# Patient Record
Sex: Male | Born: 1955 | Race: Black or African American | Hispanic: No | Marital: Married | Smoking: Former smoker
Health system: Southern US, Community
[De-identification: ages and names within clinical notes are randomized; demographics above are authoritative.]

## PROBLEM LIST (undated history)

## (undated) DIAGNOSIS — I10 Essential (primary) hypertension: Secondary | ICD-10-CM

## (undated) DIAGNOSIS — M1990 Unspecified osteoarthritis, unspecified site: Secondary | ICD-10-CM

## (undated) DIAGNOSIS — T7840XA Allergy, unspecified, initial encounter: Secondary | ICD-10-CM

## (undated) DIAGNOSIS — J45909 Unspecified asthma, uncomplicated: Secondary | ICD-10-CM

## (undated) DIAGNOSIS — E119 Type 2 diabetes mellitus without complications: Secondary | ICD-10-CM

## (undated) DIAGNOSIS — K219 Gastro-esophageal reflux disease without esophagitis: Secondary | ICD-10-CM

## (undated) HISTORY — DX: Unspecified osteoarthritis, unspecified site: M19.90

## (undated) HISTORY — DX: Essential (primary) hypertension: I10

## (undated) HISTORY — PX: WISDOM TOOTH EXTRACTION: SHX21

## (undated) HISTORY — DX: Unspecified asthma, uncomplicated: J45.909

## (undated) HISTORY — DX: Gastro-esophageal reflux disease without esophagitis: K21.9

## (undated) HISTORY — DX: Type 2 diabetes mellitus without complications: E11.9

## (undated) HISTORY — DX: Allergy, unspecified, initial encounter: T78.40XA

---
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
## 1999-09-26 ENCOUNTER — Emergency Department (HOSPITAL_COMMUNITY): Admission: EM | Admit: 1999-09-26 | Discharge: 1999-09-26 | Payer: Self-pay | Admitting: Emergency Medicine

## 1999-09-26 ENCOUNTER — Encounter: Payer: Self-pay | Admitting: Emergency Medicine

## 2003-04-18 ENCOUNTER — Emergency Department (HOSPITAL_COMMUNITY): Admission: EM | Admit: 2003-04-18 | Discharge: 2003-04-18 | Payer: Self-pay | Admitting: Emergency Medicine

## 2003-04-20 ENCOUNTER — Emergency Department (HOSPITAL_COMMUNITY): Admission: EM | Admit: 2003-04-20 | Discharge: 2003-04-20 | Payer: Self-pay | Admitting: Emergency Medicine

## 2003-07-30 ENCOUNTER — Emergency Department (HOSPITAL_COMMUNITY): Admission: EM | Admit: 2003-07-30 | Discharge: 2003-07-30 | Payer: Self-pay | Admitting: Emergency Medicine

## 2007-02-19 ENCOUNTER — Emergency Department (HOSPITAL_COMMUNITY): Admission: EM | Admit: 2007-02-19 | Discharge: 2007-02-19 | Payer: Self-pay | Admitting: Emergency Medicine

## 2007-04-11 ENCOUNTER — Emergency Department (HOSPITAL_COMMUNITY): Admission: EM | Admit: 2007-04-11 | Discharge: 2007-04-11 | Payer: Self-pay | Admitting: Emergency Medicine

## 2012-09-15 ENCOUNTER — Other Ambulatory Visit (INDEPENDENT_AMBULATORY_CARE_PROVIDER_SITE_OTHER): Payer: BC Managed Care – PPO

## 2012-09-15 ENCOUNTER — Ambulatory Visit (INDEPENDENT_AMBULATORY_CARE_PROVIDER_SITE_OTHER): Payer: BC Managed Care – PPO | Admitting: Internal Medicine

## 2012-09-15 ENCOUNTER — Encounter: Payer: Self-pay | Admitting: Internal Medicine

## 2012-09-15 VITALS — BP 128/70 | HR 83 | Temp 97.3°F | Resp 16 | Ht 68.0 in | Wt 171.0 lb

## 2012-09-15 DIAGNOSIS — IMO0001 Reserved for inherently not codable concepts without codable children: Secondary | ICD-10-CM

## 2012-09-15 DIAGNOSIS — E109 Type 1 diabetes mellitus without complications: Secondary | ICD-10-CM | POA: Insufficient documentation

## 2012-09-15 DIAGNOSIS — N529 Male erectile dysfunction, unspecified: Secondary | ICD-10-CM

## 2012-09-15 DIAGNOSIS — Z23 Encounter for immunization: Secondary | ICD-10-CM

## 2012-09-15 LAB — COMPREHENSIVE METABOLIC PANEL
ALT: 20 U/L (ref 0–53)
AST: 15 U/L (ref 0–37)
Albumin: 3.8 g/dL (ref 3.5–5.2)
Alkaline Phosphatase: 81 U/L (ref 39–117)
BUN: 14 mg/dL (ref 6–23)
Chloride: 103 mEq/L (ref 96–112)
Potassium: 4.3 mEq/L (ref 3.5–5.1)
Sodium: 136 mEq/L (ref 135–145)
Total Protein: 7.5 g/dL (ref 6.0–8.3)

## 2012-09-15 LAB — MICROALBUMIN / CREATININE URINE RATIO
Creatinine,U: 66 mg/dL
Microalb, Ur: 0.8 mg/dL (ref 0.0–1.9)

## 2012-09-15 LAB — CBC WITH DIFFERENTIAL/PLATELET
Basophils Absolute: 0 10*3/uL (ref 0.0–0.1)
Eosinophils Absolute: 0.1 10*3/uL (ref 0.0–0.7)
Lymphocytes Relative: 44.8 % (ref 12.0–46.0)
MCHC: 33.3 g/dL (ref 30.0–36.0)
MCV: 86.9 fl (ref 78.0–100.0)
Monocytes Absolute: 0.6 10*3/uL (ref 0.1–1.0)
Neutrophils Relative %: 46.2 % (ref 43.0–77.0)
Platelets: 262 10*3/uL (ref 150.0–400.0)
RDW: 13.3 % (ref 11.5–14.6)

## 2012-09-15 LAB — URINALYSIS, ROUTINE W REFLEX MICROSCOPIC
Bilirubin Urine: NEGATIVE
Hgb urine dipstick: NEGATIVE
Ketones, ur: NEGATIVE
Leukocytes, UA: NEGATIVE
Specific Gravity, Urine: 1.015 (ref 1.000–1.030)
Urine Glucose: >=1000
Urobilinogen, UA: 0.2 (ref 0.0–1.0)

## 2012-09-15 LAB — LIPID PANEL
Total CHOL/HDL Ratio: 7
Triglycerides: 236 mg/dL — ABNORMAL HIGH (ref 0.0–149.0)

## 2012-09-15 LAB — HEMOGLOBIN A1C: Hgb A1c MFr Bld: 13.3 % — ABNORMAL HIGH (ref 4.6–6.5)

## 2012-09-15 MED ORDER — SITAGLIPTIN PHOS-METFORMIN HCL 50-1000 MG PO TABS: 1.0000 | ORAL_TABLET | Freq: Two times a day (BID) | ORAL | Status: DC

## 2012-09-15 MED ORDER — INSULIN GLARGINE 100 UNIT/ML ~~LOC~~ SOLN: 50.0000 [IU] | Freq: Every day | SUBCUTANEOUS | Status: DC

## 2012-09-15 NOTE — Patient Instructions (Signed)

## 2012-09-15 NOTE — Progress Notes (Signed)
Subjective:    Patient ID: Antonio Baldwin, male    DOB: [birth year], 56 y.o.   MRN: [id number]  Diabetes He presents for his follow-up diabetic visit. He has type 1 diabetes mellitus. The initial diagnosis of diabetes was made 6 years ago. His disease course has been worsening. There are no hypoglycemic associated symptoms. Pertinent negatives for hypoglycemia include no dizziness. Associated symptoms include polydipsia, polyphagia, polyuria and weight loss. Pertinent negatives for diabetes include no blurred vision, no chest pain, no fatigue, no foot paresthesias, no foot ulcerations, no visual change and no weakness. There are no hypoglycemic complications. Diabetic complications include impotence. When asked about current treatments, none were reported. He is compliant with treatment none of the time. His weight is stable. He is following a generally unhealthy diet. When asked about meal planning, he reported none. He has not had a previous visit with a dietician. He participates in exercise intermittently. His home blood glucose trend is increasing steadily. His breakfast blood glucose range is generally >200 mg/dl. His lunch blood glucose range is generally >200 mg/dl. His dinner blood glucose range is generally >200 mg/dl. His highest blood glucose is >200 mg/dl. His overall blood glucose range is >200 mg/dl. An ACE inhibitor/angiotensin II receptor blocker is not being taken. He does not see a podiatrist.Eye exam is not current.      Review of Systems  Constitutional: Positive for weight loss and unexpected weight change. Negative for fever, chills, diaphoresis, activity change, appetite change and fatigue.  HENT: Negative.   Eyes: Negative.  Negative for blurred vision.  Respiratory: Negative for cough, chest tightness, shortness of breath, wheezing and stridor.   Cardiovascular: Negative for chest pain, palpitations and leg swelling.  Gastrointestinal: Negative for nausea, vomiting,  abdominal pain, diarrhea, constipation and abdominal distention.  Genitourinary: Positive for polyuria and impotence.  Musculoskeletal: Negative.   Skin: Negative.   Neurological: Negative.  Negative for dizziness and weakness.  Hematological: Positive for polydipsia and polyphagia. Negative for adenopathy. Does not bruise/bleed easily.  Psychiatric/Behavioral: Negative.        Objective:   Physical Exam  Vitals reviewed. Constitutional: He is oriented to person, place, and time. He appears well-developed and well-nourished. No distress.  HENT:  Head: Normocephalic and atraumatic.  Mouth/Throat: Oropharynx is clear and moist. No oropharyngeal exudate.  Eyes: Conjunctivae normal are normal. Right eye exhibits no discharge. Left eye exhibits no discharge. No scleral icterus.  Neck: Normal range of motion. Neck supple. No JVD present. No tracheal deviation present. No thyromegaly present.  Cardiovascular: Normal rate, regular rhythm, normal heart sounds and intact distal pulses.  Exam reveals no gallop and no friction rub.   No murmur heard. Pulmonary/Chest: Effort normal and breath sounds normal. No stridor. No respiratory distress. He has no wheezes. He has no rales. He exhibits no tenderness.  Abdominal: Soft. Bowel sounds are normal. He exhibits no distension and no mass. There is no tenderness. There is no rebound and no guarding.  Musculoskeletal: Normal range of motion. He exhibits no edema and no tenderness.  Lymphadenopathy:    He has no cervical adenopathy.  Neurological: He is oriented to person, place, and time.  Skin: Skin is warm and dry. No rash noted. He is not diaphoretic. No erythema. No pallor.  Psychiatric: He has a normal mood and affect. His behavior is normal. Judgment and thought content normal.      No results found for this basename: WBC, HGB, HCT, PLT, GLUCOSE, CHOL, TRIG, HDL, LDLDIRECT, LDLCALC,  ALT, AST, NA, K, CL, CREATININE, BUN, CO2, TSH, PSA, INR, GLUF,  HGBA1C, MICROALBUR      Assessment & Plan:

## 2012-09-16 ENCOUNTER — Encounter: Payer: Self-pay | Admitting: Internal Medicine

## 2012-09-16 LAB — TSH: TSH: 0.58 u[IU]/mL (ref 0.35–5.50)

## 2012-09-16 LAB — TESTOSTERONE, FREE, TOTAL, SHBG
Sex Hormone Binding: 34 nmol/L (ref 13–71)
Testosterone, Free: 62.4 pg/mL (ref 47.0–244.0)

## 2012-09-16 LAB — PSA: PSA: 0.37 ng/mL (ref 0.10–4.00)

## 2012-09-16 LAB — C-PEPTIDE: C-Peptide: 1.01 ng/mL (ref 0.80–3.90)

## 2012-09-16 NOTE — Assessment & Plan Note (Signed)
His blood sugars are poorly controlled, I have asked him to restart insulin and oral meds, I will check his a1c and renal function today

## 2012-09-16 NOTE — Assessment & Plan Note (Signed)
This is most likely caused by years of poorly controlled DM, I will check his labs today to look for secondary causes (thyroid dz., low T, anemia, prostate cancer,etc.)

## 2012-11-16 ENCOUNTER — Emergency Department (HOSPITAL_COMMUNITY)
Admission: EM | Admit: 2012-11-16 | Discharge: 2012-11-16 | Disposition: A | Discharge status: Home / Self Care | Payer: BC Managed Care – PPO | Attending: Emergency Medicine | Admitting: Emergency Medicine

## 2012-11-16 ENCOUNTER — Encounter (HOSPITAL_COMMUNITY): Payer: Self-pay | Admitting: *Deleted

## 2012-11-16 DIAGNOSIS — K089 Disorder of teeth and supporting structures, unspecified: Secondary | ICD-10-CM | POA: Insufficient documentation

## 2012-11-16 DIAGNOSIS — Z794 Long term (current) use of insulin: Secondary | ICD-10-CM | POA: Insufficient documentation

## 2012-11-16 DIAGNOSIS — E119 Type 2 diabetes mellitus without complications: Secondary | ICD-10-CM | POA: Insufficient documentation

## 2012-11-16 DIAGNOSIS — Z87891 Personal history of nicotine dependence: Secondary | ICD-10-CM | POA: Insufficient documentation

## 2012-11-16 DIAGNOSIS — K0889 Other specified disorders of teeth and supporting structures: Secondary | ICD-10-CM

## 2012-11-16 DIAGNOSIS — Z8739 Personal history of other diseases of the musculoskeletal system and connective tissue: Secondary | ICD-10-CM | POA: Insufficient documentation

## 2012-11-16 DIAGNOSIS — Z79899 Other long term (current) drug therapy: Secondary | ICD-10-CM | POA: Insufficient documentation

## 2012-11-16 DIAGNOSIS — J45909 Unspecified asthma, uncomplicated: Secondary | ICD-10-CM | POA: Insufficient documentation

## 2012-11-16 MED ORDER — PENICILLIN V POTASSIUM 500 MG PO TABS: 500.0000 mg | ORAL_TABLET | Freq: Three times a day (TID) | ORAL | Status: DC

## 2012-11-16 MED ORDER — HYDROCODONE-ACETAMINOPHEN 5-325 MG PO TABS: 2.0000 | ORAL_TABLET | ORAL | Status: DC | PRN

## 2012-11-16 MED ORDER — IBUPROFEN 800 MG PO TABS
800.0000 mg | ORAL_TABLET | Freq: Once | ORAL | Status: AC
  Administered 2012-11-16: 800 mg via ORAL
  Filled 2012-11-16: qty 1

## 2012-11-16 NOTE — ED Notes (Signed)
Pt states injured upper left tooth and gum while eating x 3 days ago.

## 2012-11-16 NOTE — ED Provider Notes (Signed)
History     CSN: [id number]  Arrival date & time 11/16/12  0818   First MD Initiated Contact with Patient 11/16/12 0830      Chief Complaint  Patient presents with  . Dental Injury    upper left tooth    (Consider location/radiation/quality/duration/timing/severity/associated sxs/prior treatment) HPI Comments: The patient is a 56 year old otherwise healthy male who presents with dental pain that started gradually 5 days. The dental pain is severe, constant and progressively worsening. The pain is aching and located in left upper jaw. The pain does not radiate. Eating makes the pain worse. Nothing makes the pain better. The patient has not tried anything for pain. No associated symptoms. Patient denies headache, neck pain/stiffness, fever, NVD, edema, sore throat, throat swelling, wheezing, SOB, chest pain, abdominal pain.     Patient is a 56 y.o. male presenting with dental injury.  Dental Injury    Past Medical History  Diagnosis Date  . Asthma   . Arthritis   . Diabetes mellitus without complication   . Allergy     Past Surgical History  Procedure Date  . Tonsillectomy and adenoidectomy 1960    Family History  Problem Relation Age of Onset  . Arthritis Other   . Hypertension Other   . Diabetes Other   . Arthritis Mother   . Hypertension Mother   . Diabetes Mother   . Heart disease Neg Hx   . Early death Neg Hx   . Stroke Neg Hx     History  Substance Use Topics  . Smoking status: Former Smoker    Quit date: 09/15/2002  . Smokeless tobacco: Never Used  . Alcohol Use: No      Review of Systems  HENT: Positive for dental problem.   All other systems reviewed and are negative.    Allergies  Review of patient's allergies indicates no known allergies.  Home Medications   Current Outpatient Rx  Name  Route  Sig  Dispense  Refill  . INSULIN GLARGINE 100 UNIT/ML [place] SOLN   Subcutaneous   Inject 50 Units into the skin daily.   3 mL   11   .  SITAGLIPTIN-METFORMIN HCL 50-1000 MG PO TABS   Oral   Take 1 tablet by mouth 2 (two) times daily with a meal.   112 tablet   0     BP 162/82  Pulse 96  Temp 98.4 F (36.9 C) (Oral)  Resp 16  Ht 5\' 8"  (1.727 m)  Wt 164 lb (74.39 kg)  BMI 24.94 kg/m2  SpO2 96%  Physical Exam  Nursing note and vitals reviewed. Constitutional: He is oriented to person, place, and time. He appears well-developed and well-nourished. No distress.  HENT:  Head: Normocephalic and atraumatic.  Mouth/Throat: Oropharynx is clear and moist. No oropharyngeal exudate.       Poor dentition. Edematous and erythematous gums. Teeth non tender to percussion.   Eyes: Conjunctivae normal and EOM are normal. Pupils are equal, round, and reactive to light.  Neck: Normal range of motion. Neck supple.  Cardiovascular: Normal rate and regular rhythm.  Exam reveals no gallop and no friction rub.   No murmur heard. Pulmonary/Chest: Effort normal and breath sounds normal. He has no wheezes. He has no rales. He exhibits no tenderness.  Abdominal: Soft. There is no tenderness.  Musculoskeletal: Normal range of motion.  Neurological: He is alert and oriented to person, place, and time.       Speech is  goal-oriented. Moves limbs without ataxia.   Skin: Skin is warm and dry.  Psychiatric: He has a normal mood and affect. His behavior is normal.    ED Course  Procedures (including critical care time)  Labs Reviewed - No data to display No results found.   1. Pain, dental       MDM  8:40 AM Patient has a dentist that he will follow up with after Christmas. Vitals stable. Patient will have pain medicine and antibiotic prescription. No further evaluation needed at this time.         Emilia Beck, [place] 11/16/12 [phone]

## 2012-11-17 NOTE — ED Provider Notes (Signed)
Medical screening examination/treatment/procedure(s) were performed by non-physician practitioner and as supervising physician I was immediately available for consultation/collaboration.   Shelda Jakes, MD 11/17/12 [phone]

## 2013-01-17 ENCOUNTER — Encounter (HOSPITAL_COMMUNITY): Payer: Self-pay | Admitting: Emergency Medicine

## 2013-01-17 ENCOUNTER — Emergency Department (HOSPITAL_COMMUNITY)
Admission: EM | Admit: 2013-01-17 | Discharge: 2013-01-17 | Disposition: A | Discharge status: Home / Self Care | Payer: BC Managed Care – PPO | Attending: Emergency Medicine | Admitting: Emergency Medicine

## 2013-01-17 DIAGNOSIS — Z87891 Personal history of nicotine dependence: Secondary | ICD-10-CM | POA: Insufficient documentation

## 2013-01-17 DIAGNOSIS — E119 Type 2 diabetes mellitus without complications: Secondary | ICD-10-CM | POA: Insufficient documentation

## 2013-01-17 DIAGNOSIS — Z8739 Personal history of other diseases of the musculoskeletal system and connective tissue: Secondary | ICD-10-CM | POA: Insufficient documentation

## 2013-01-17 DIAGNOSIS — M5416 Radiculopathy, lumbar region: Secondary | ICD-10-CM

## 2013-01-17 DIAGNOSIS — Z792 Long term (current) use of antibiotics: Secondary | ICD-10-CM | POA: Insufficient documentation

## 2013-01-17 DIAGNOSIS — Z794 Long term (current) use of insulin: Secondary | ICD-10-CM | POA: Insufficient documentation

## 2013-01-17 DIAGNOSIS — Z79899 Other long term (current) drug therapy: Secondary | ICD-10-CM | POA: Insufficient documentation

## 2013-01-17 DIAGNOSIS — IMO0002 Reserved for concepts with insufficient information to code with codable children: Secondary | ICD-10-CM | POA: Insufficient documentation

## 2013-01-17 DIAGNOSIS — J45909 Unspecified asthma, uncomplicated: Secondary | ICD-10-CM | POA: Insufficient documentation

## 2013-01-17 MED ORDER — TRAMADOL HCL 50 MG PO TABS: 50.0000 mg | ORAL_TABLET | Freq: Four times a day (QID) | ORAL | Status: DC | PRN

## 2013-01-17 NOTE — ED Notes (Signed)
Woke this am with right thigh pain when he went to get out of bed. Now pain with movement.

## 2013-01-17 NOTE — ED Provider Notes (Signed)
Medical screening examination/treatment/procedure(s) were performed by non-physician practitioner and as supervising physician I was immediately available for consultation/collaboration. Devoria Albe, MD, [facility]   Ward Givens, MD 01/17/13 [phone]

## 2013-01-17 NOTE — ED Provider Notes (Signed)
History     CSN: [id number]  Arrival date & time 01/17/13  4098   First MD Initiated Contact with Patient 01/17/13 [phone]      No chief complaint on file.   (Consider location/radiation/quality/duration/timing/severity/associated sxs/prior treatment) HPI  Antonio Baldwin is a 57 y.o. male complaining of right hip pain that radiates to the knee onset yesterday a.m. Pain is described as aching, 8/10 right now, was 10 out of 10 yesterday. Patient has not taken any pain medication. Pain is exacerbated by standing. Is relieved by laying flat. Patient denies any trauma, prior injury to the affected area, similar prior episodes, numbness or weakness.  Past Medical History  Diagnosis Date  . Asthma   . Arthritis   . Diabetes mellitus without complication   . Allergy     Past Surgical History  Procedure Laterality Date  . Tonsillectomy and adenoidectomy  1960    Family History  Problem Relation Age of Onset  . Arthritis Other   . Hypertension Other   . Diabetes Other   . Arthritis Mother   . Hypertension Mother   . Diabetes Mother   . Heart disease Neg Hx   . Early death Neg Hx   . Stroke Neg Hx     History  Substance Use Topics  . Smoking status: Former Smoker    Quit date: 09/15/2002  . Smokeless tobacco: Never Used  . Alcohol Use: No      Review of Systems  Constitutional: Negative for fever.  Respiratory: Negative for shortness of breath.   Cardiovascular: Negative for chest pain.  Gastrointestinal: Negative for nausea, vomiting, abdominal pain and diarrhea.  Musculoskeletal: Positive for arthralgias.  All other systems reviewed and are negative.    Allergies  Review of patient's allergies indicates no known allergies.  Home Medications   Current Outpatient Rx  Name  Route  Sig  Dispense  Refill  . insulin glargine (LANTUS) 100 UNIT/ML injection   Subcutaneous   Inject 50 Units into the skin daily.         . penicillin v potassium (VEETID) 500 MG  tablet   Oral   Take 500 mg by mouth 3 (three) times daily.         . sitaGLIPtan-metformin (JANUMET) 50-1000 MG per tablet   Oral   Take 1 tablet by mouth 2 (two) times daily with a meal.           BP 139/76  Pulse 89  Temp(Src) 97.1 F (36.2 C) (Oral)  SpO2 100%  Physical Exam  Nursing note and vitals reviewed. Constitutional: He is oriented to person, place, and time. He appears well-developed and well-nourished. No distress.  HENT:  Head: Normocephalic.  Mouth/Throat: Oropharynx is clear and moist.  Eyes: Conjunctivae and EOM are normal. Pupils are equal, round, and reactive to light.  Neck: Normal range of motion. Neck supple.  Cardiovascular: Normal rate, regular rhythm and normal heart sounds.   Pulmonary/Chest: Effort normal and breath sounds normal. No stridor.  Abdominal: Soft. Bowel sounds are normal.  Musculoskeletal: Normal range of motion.  Straight leg raise positive on the ipsilateral (right) side at 10, straight leg raise is positive on the contralateral side at approximately 50 degrees, dorsalis pedis is 2+ bilaterally, strength is 5 out of 5x4 extremities, distal sensation is grossly intact.   Neurological: He is alert and oriented to person, place, and time.  Psychiatric: He has a normal mood and affect.    ED Course  Procedures (  including critical care time)  Labs Reviewed - No data to display No results found.   1. Lumbar radiculopathy       MDM  Radicular pain, discussed with patient, will recommend NSAIDs for anti-inflammatory properties and tramadol for pain control.   Pt verbalized understanding and agrees with care plan. Outpatient follow-up and return precautions given.     New Prescriptions   TRAMADOL (ULTRAM) 50 MG TABLET    Take 1 tablet (50 mg total) by mouth every 6 (six) hours as needed for pain.          Wynetta Emery, PA-C 01/17/13 [phone]

## 2013-04-11 ENCOUNTER — Telehealth: Payer: Self-pay | Admitting: Internal Medicine

## 2013-04-11 NOTE — Telephone Encounter (Signed)
Patient is scheduled to see Dr. Yetta Barre Friday 04/15/13, he has been out of his insulin and his blood sugar is high, pt is requesting sample of insulin to get him through to his appt

## 2013-04-11 NOTE — Telephone Encounter (Signed)
Pt made aware sample is ready to be picked up.

## 2013-04-15 ENCOUNTER — Telehealth: Payer: Self-pay

## 2013-04-15 ENCOUNTER — Other Ambulatory Visit (INDEPENDENT_AMBULATORY_CARE_PROVIDER_SITE_OTHER): Payer: BC Managed Care – PPO

## 2013-04-15 ENCOUNTER — Encounter: Payer: Self-pay | Admitting: Internal Medicine

## 2013-04-15 ENCOUNTER — Ambulatory Visit (INDEPENDENT_AMBULATORY_CARE_PROVIDER_SITE_OTHER): Payer: BC Managed Care – PPO | Admitting: Internal Medicine

## 2013-04-15 VITALS — BP 138/86 | HR 80 | Temp 98.1°F | Resp 16 | Wt 177.0 lb

## 2013-04-15 DIAGNOSIS — E785 Hyperlipidemia, unspecified: Secondary | ICD-10-CM

## 2013-04-15 DIAGNOSIS — E109 Type 1 diabetes mellitus without complications: Secondary | ICD-10-CM

## 2013-04-15 DIAGNOSIS — I10 Essential (primary) hypertension: Secondary | ICD-10-CM | POA: Insufficient documentation

## 2013-04-15 LAB — COMPREHENSIVE METABOLIC PANEL
AST: 17 U/L (ref 0–37)
Alkaline Phosphatase: 71 U/L (ref 39–117)
BUN: 11 mg/dL (ref 6–23)
Creatinine, Ser: 0.6 mg/dL (ref 0.4–1.5)
Potassium: 3.7 mEq/L (ref 3.5–5.1)

## 2013-04-15 LAB — HM DIABETES FOOT EXAM

## 2013-04-15 MED ORDER — ROSUVASTATIN CALCIUM 10 MG PO TABS: 10.0000 mg | ORAL_TABLET | Freq: Every day | ORAL | Status: DC

## 2013-04-15 MED ORDER — INSULIN GLARGINE 100 UNIT/ML ~~LOC~~ SOLN: 50.0000 [IU] | Freq: Every day | SUBCUTANEOUS | Status: DC

## 2013-04-15 MED ORDER — AZILSARTAN MEDOXOMIL 40 MG PO TABS: 1.0000 | ORAL_TABLET | Freq: Every day | ORAL | Status: DC

## 2013-04-15 MED ORDER — INSULIN ASPART 100 UNIT/ML FLEXPEN: 5.0000 [IU] | PEN_INJECTOR | Freq: Three times a day (TID) | SUBCUTANEOUS | Status: DC

## 2013-04-15 NOTE — Assessment & Plan Note (Signed)
His Blood sugars are too high I have asked him to start a mealtime insulin This is complicated so I have also asked him to see Endo and diabetic education

## 2013-04-15 NOTE — Assessment & Plan Note (Signed)
He will start an ACEI Today I will check his lytes and renal function

## 2013-04-15 NOTE — Progress Notes (Signed)
Subjective:    Patient ID: Antonio Baldwin, male    DOB: [birth year], 57 y.o.   MRN: [id number]  Diabetes He presents for his follow-up diabetic visit. He has type 1 diabetes mellitus. Pertinent negatives for hypoglycemia include no dizziness. Associated symptoms include polydipsia, polyphagia and polyuria. Pertinent negatives for diabetes include no chest pain, no fatigue, no foot paresthesias, no foot ulcerations, no visual change, no weakness and no weight loss. There are no hypoglycemic complications. Diabetic complications include impotence. There are no known risk factors for coronary artery disease. Current diabetic treatment includes oral agent (monotherapy) and insulin injections. He is compliant with treatment some of the time (he is unable to pay for most of his meds). His weight is stable. He is following a generally healthy diet. Meal planning includes avoidance of concentrated sweets. He has not had a previous visit with a dietician. He participates in exercise three times a week. There is no change in his home blood glucose trend. His breakfast blood glucose range is generally 140-180 mg/dl. His lunch blood glucose range is generally 180-200 mg/dl. His dinner blood glucose range is generally 180-200 mg/dl. His highest blood glucose is >200 mg/dl. His overall blood glucose range is 180-200 mg/dl. An ACE inhibitor/angiotensin II receptor blocker is not being taken. He does not see a podiatrist.Eye exam is not current.      Review of Systems  Constitutional: Negative.  Negative for weight loss and fatigue.  HENT: Negative.   Eyes: Negative.   Respiratory: Negative.  Negative for cough, chest tightness, shortness of breath, wheezing and stridor.   Cardiovascular: Negative for chest pain, palpitations and leg swelling.  Gastrointestinal: Negative.  Negative for nausea, abdominal pain, diarrhea, constipation and blood in stool.  Endocrine: Positive for polydipsia, polyphagia and polyuria.   Genitourinary: Positive for impotence.  Musculoskeletal: Negative.   Skin: Negative.   Allergic/Immunologic: Negative.   Neurological: Negative.  Negative for dizziness, weakness and light-headedness.  Hematological: Negative.  Negative for adenopathy. Does not bruise/bleed easily.  Psychiatric/Behavioral: Negative.        Objective:   Physical Exam  Vitals reviewed. Constitutional: He is oriented to person, place, and time. He appears well-developed and well-nourished. No distress.  HENT:  Head: Normocephalic and atraumatic.  Mouth/Throat: Oropharynx is clear and moist.  Eyes: Conjunctivae are normal. Right eye exhibits no discharge. Left eye exhibits no discharge. No scleral icterus.  Neck: Normal range of motion. Neck supple. No JVD present. No tracheal deviation present. No thyromegaly present.  Cardiovascular: Normal rate, regular rhythm, normal heart sounds and intact distal pulses.  Exam reveals no gallop and no friction rub.   No murmur heard. Pulmonary/Chest: Effort normal and breath sounds normal. No stridor. No respiratory distress. He has no rales. He exhibits no tenderness.  Abdominal: Soft. Bowel sounds are normal. He exhibits no distension and no mass. There is no tenderness. There is no rebound and no guarding.  Musculoskeletal: Normal range of motion. He exhibits no edema and no tenderness.  Lymphadenopathy:    He has no cervical adenopathy.  Neurological: He is oriented to person, place, and time.  Skin: Skin is warm and dry. No rash noted. He is not diaphoretic. No erythema. No pallor.  Psychiatric: He has a normal mood and affect. His behavior is normal. Judgment and thought content normal.     Lab Results  Component Value Date   WBC 7.3 09/15/2012   HGB 13.9 09/15/2012   HCT 41.8 09/15/2012   PLT 262.0 09/15/2012  GLUCOSE 268* 04/15/2013   CHOL 229* 09/15/2012   TRIG 236.0* 09/15/2012   HDL 32.60* 09/15/2012   LDLDIRECT 146.0 09/15/2012   ALT 20  04/15/2013   AST 17 04/15/2013   NA 135 04/15/2013   K 3.7 04/15/2013   CL 101 04/15/2013   CREATININE 0.6 04/15/2013   BUN 11 04/15/2013   CO2 26 04/15/2013   TSH 0.58 09/15/2012   PSA 0.37 09/15/2012   HGBA1C 15.4* 04/15/2013   MICROALBUR 0.8 09/15/2012       Assessment & Plan:

## 2013-04-15 NOTE — Telephone Encounter (Signed)
Received PA request for insurance company requesting completion to determine if Novolog will be covered.

## 2013-04-15 NOTE — Assessment & Plan Note (Signed)
He will start crestor for risk reduction 

## 2013-04-15 NOTE — Patient Instructions (Signed)
Type 1 Diabetes Mellitus, Adult Type 1 diabetes mellitus, often simply referred to as diabetes, is a long-term (chronic) disease. It occurs when the islet cells in the pancreas that make insulin (a hormone) are destroyed and can no longer make insulin. Insulin is needed to move sugars from food into the tissue cells. The tissue cells use the sugars for energy. In people with type 1 diabetes, the sugars build up in the blood instead of going into the tissue cells. As a result, high blood sugar (hyperglycemia) develops. Without insulin, the body breaks down fat cells for the needed energy. This breakdown of fat cells produces acid chemicals (ketones), which increases the acid levels in the body. The effect of either high ketone or sugar (glucose) levels can be life-threatening.  Type 1 diabetes was also previously called juvenile diabetes. It most often occurs before the age of 30, but it can occur at any age. RISK FACTORS A person is predisposed to developing type 1 diabetes if someone in his or her family has the disease and is exposed to certain additional environmental triggers.  SYMPTOMS  Symptoms of type 1 diabetes may develop gradually over days to weeks or suddenly. The symptoms occur due to hyperglycemia. The symptoms can include:   Increased thirst (polydipsia).  Increased urination (polyuria).  Increased urination during the night (nocturia).  Weight loss. This weight loss may be rapid.  Frequent, recurring infections.  Tiredness (fatigue).  Weakness.  Vision changes, such as blurred vision.  Fruity smell to your breath.  Abdominal pain.  Nausea or vomiting. DIAGNOSIS  Type 1 diabetes is diagnosed when symptoms of diabetes are present and when blood glucose levels are increased. Your blood glucose level may be checked by one or more of the following blood tests:  A fasting blood glucose test. You will not be allowed to eat for at least 8 hours before a blood sample is  taken.  A random blood glucose test. Your blood glucose is checked at any time of the day regardless of when you ate.  A hemoglobin A1c blood glucose test. A hemoglobin A1c test provides information about blood glucose control over the previous 3 months. TREATMENT  Although type 1 diabetes cannot be prevented, it can be managed with insulin, diet, and exercise.  You will need to take insulin daily to keep blood glucose in the desired range.  You will need to match insulin dosing with exercise and healthy food choices. The treatment goal is to maintain the before-meal blood sugar (preprandial glucose) level at 70 130 mg/dL.  HOME CARE INSTRUCTIONS   Have your hemoglobin A1c level checked twice a year.  Perform daily blood glucose monitoring as directed by your caregiver.  Monitor urine ketones when you are ill and as directed by your caregiver.  Take your insulin as directed by your caregiver to maintain your blood glucose level in the desired range.  Never run out of insulin. It is needed every day.  Adjust insulin based on your intake of carbohydrates. Carbohydrates can raise blood glucose levels but need to be included in your diet. Carbohydrates provide vitamins, minerals, and fiber, which are an essential part of a healthy diet. Carbohydrates are found in fruits, vegetables, whole grains, dairy products, legumes, and foods containing added sugars.    Eat healthy foods. Alternate 3 meals with 3 snacks.  Maintain a healthy weight.  Carry a medical alert card or wear your medical alert jewelry.  Carry a 15 gram carbohydrate snack with you   at all times to treat low blood glucose (hypoglycemia). Some examples of 15 gram carbohydrate snacks include:  Glucose tablets, 3 or 4.   Glucose gel, 15 gram tube.  Raisins, 2 tablespoons (24 grams).  Jelly beans, 6.  Animal crackers, 8.  Fruit juice, regular soda, or low-fat milk, 4 ounces (120 mL).  Gummy treats,  9.    Recognize hypoglycemia. Hypoglycemia occurs with blood glucose levels of 70 mg/dL and below. The risk for hypoglycemia increases when fasting or skipping meals, during or after intense exercise, and during sleep. Hypoglycemia symptoms can include:  Tremors or shakes.  Decreased ability to concentrate.  Sweating.  Increased heart rate.  Headache.  Dry mouth.  Hunger.  Irritability.  Anxiety.  Restless sleep.  Altered speech or coordination.  Confusion.  Treat hypoglycemia promptly. If you are alert and able to safely swallow, follow the 15:15 rule:  Take 15 20 grams of rapid-acting glucose or carbohydrate. Rapid-acting options include glucose gel, glucose tablets, or 4 ounces (120 mL) of fruit juice, regular soda, or low-fat milk.  Check your blood glucose level 15 minutes after taking the glucose.   Take 15 20 grams more of glucose if the repeat blood glucose level is still 70 mg/dL or below.  Eat a meal or snack within 1 hour once blood glucose levels return to normal.  Be alert to polyuria and polydipsia, which are early signs of hyperglycemia. An early awareness of hyperglycemia allows for prompt treatment. Treat hyperglycemia as directed by your caregiver.  Engage in at least 150 minutes of moderate-intensity physical activity a week, spread over at least 3 days of the week or as directed by your caregiver.  Adjust your insulin dosing and food intake as needed if you start a new exercise or sport.  Follow your sick day plan at any time you are unable to eat or drink as usual.   Avoid tobacco use.  Limit alcohol intake to no more than 1 drink per day for nonpregnant women and 2 drinks per day for men. You should drink alcohol only when you are also eating food. Talk with your caregiver about whether alcohol is safe for you. Tell your caregiver if you drink alcohol several times a week.  Follow up with your caregiver regularly.  Schedule an eye exam  within 5 years of diagnosis and then annually.  Perform daily skin and foot care. Examine your skin and feet daily for cuts, bruises, redness, nail problems, bleeding, blisters, or sores. A foot exam by a caregiver should be done annually.  Brush your teeth and gums at least twice a day and floss at least once a day. Follow up with your dentist regularly.  Share your diabetes management plan with your workplace or school.  Stay up-to-date with immunizations.  Learn to manage stress.  Obtain ongoing diabetes education and support as needed.  Participate or seek rehabilitation as needed to maintain or improve independence and quality of life. Request a physical or occupational therapy referral if you are having foot or hand numbness or difficulties with grooming, dressing, eating, or physical activity. SEEK MEDICAL CARE IF:   You are unable to eat food or drink fluids for more than 6 hours.  You have nausea and vomiting for more than 6 hours.  Your blood glucose level is over 240 mg/dL.  There is a change in mental status.  You develop an additional serious illness.  You have diarrhea for more than 6 hours.  You have been   sick or have had a fever for a couple of days and are not getting better.  You have pain during any physical activity. SEEK IMMEDIATE MEDICAL CARE IF:  You have difficulty breathing.  You have moderate to large ketone levels. MAKE SURE YOU:  Understand these instructions.  Will watch your condition.  Will get help right away if you are not doing well or get worse. Document Released: 11/07/2000 Document Revised: 08/04/2012 Document Reviewed: 06/08/2012 [employer] Patient Information 2014 [employer].  

## 2013-04-19 NOTE — Telephone Encounter (Signed)
Received fax also requesting PA on [employer]

## 2013-04-20 NOTE — Telephone Encounter (Signed)
Insurance approved Edarbi  04/19/13 to 11/23/38, pharmacy notified

## 2013-04-25 ENCOUNTER — Ambulatory Visit (INDEPENDENT_AMBULATORY_CARE_PROVIDER_SITE_OTHER): Payer: BC Managed Care – PPO | Admitting: Endocrinology

## 2013-04-25 ENCOUNTER — Encounter: Payer: Self-pay | Admitting: Endocrinology

## 2013-04-25 VITALS — BP 126/80 | HR 80 | Ht 68.0 in | Wt 182.0 lb

## 2013-04-25 DIAGNOSIS — E109 Type 1 diabetes mellitus without complications: Secondary | ICD-10-CM

## 2013-04-25 NOTE — Patient Instructions (Addendum)
good diet and exercise habits significanly improve the control of your diabetes.  please let me know if you wish to be referred to a dietician.  high blood sugar is very risky to your health.  you should see an eye doctor every year.  You are at higher than average risk for pneumonia and hepatitis-B.  You should be vaccinated against both.   controlling your blood pressure and cholesterol drastically reduces the damage diabetes does to your body.  this also applies to quitting smoking.  please discuss these with your doctor.  you should take an aspirin every day, unless you have been advised by a doctor not to. check your blood sugar twice a day.  vary the time of day when you check, between before the 3 meals, and at bedtime.  also check if you have symptoms of your blood sugar being too high or too low.  please keep a record of the readings and bring it to your next appointment here.  please call us sooner if your blood sugar goes below 70, or if you have a lot of readings over 200. we will need to take this complex situation in stages.  Please continue the same insulins for now.  Please come back for a follow-up appointment in 2 weeks.

## 2013-04-25 NOTE — Progress Notes (Signed)
Subjective:    Patient ID: Antonio Baldwin, male    DOB: [birth year], 57 y.o.   MRN: [id number]  HPI pt states 22 years h/o dm.  he is unaware of any chronic complications.  he has been on insulin since dx.  pt says his diet and exercise are good.  He has no numbness of his feet, or assoc pain. He says he was out of his insulin x 1 month prior to the recent a1c.  Since back on the insulin, he says he never misses it.  He was recently started on qac novolog, in addition to the lantus.  He says this has improved his cbg's, but they are still quite variable.  He says he has mild hypoglycemia with exertion, but that this is not a consistent effect.  Past Medical History  Diagnosis Date  . Asthma   . Arthritis   . Diabetes mellitus without complication   . Allergy     Past Surgical History  Procedure Laterality Date  . Tonsillectomy and adenoidectomy  1960    History   Social History  . Marital Status: Married    Spouse Name: N/A    Number of Children: N/A  . Years of Education: N/A   Occupational History  . Not on file.   Social History Main Topics  . Smoking status: Former Smoker    Quit date: 09/15/2002  . Smokeless tobacco: Never Used  . Alcohol Use: No  . Drug Use: No  . Sexually Active: Yes   Other Topics Concern  . Not on file   Social History Narrative  . No narrative on file    Current Outpatient Prescriptions on File Prior to Visit  Medication Sig Dispense Refill  . Azilsartan Medoxomil (EDARBI) 40 MG TABS Take 1 tablet by mouth daily.  90 tablet  3  . insulin aspart (NOVOLOG FLEXPEN) 100 unit/mL SOLN FlexPen Inject 5 Units into the skin 3 (three) times daily with meals.  3 mL  11  . insulin glargine (LANTUS) 100 UNIT/ML injection Inject 0.5 mLs (50 Units total) into the skin daily.  10 mL  11  . metFORMIN (GLUCOPHAGE) 500 MG tablet Take 500 mg by mouth 2 (two) times daily with a meal.      . rosuvastatin (CRESTOR) 10 MG tablet Take 1 tablet (10 mg total) by  mouth daily.  126 tablet  3  . traMADol (ULTRAM) 50 MG tablet Take 1 tablet (50 mg total) by mouth every 6 (six) hours as needed for pain.  15 tablet  0   No current facility-administered medications on file prior to visit.    No Known Allergies  Family History  Problem Relation Age of Onset  . Arthritis Other   . Hypertension Other   . Diabetes Other   . Arthritis Mother   . Hypertension Mother   . Diabetes Mother   . Heart disease Neg Hx   . Early death Neg Hx   . Stroke Neg Hx    There were no vitals taken for this visit.  Review of Systems denies weight loss, headache, chest pain, sob, n/v, cramps, excessive diaphoresis, memory loss, depression, and rhinorrhea.  He has intermittent blurry vision, ED sxs, easy bruising, and polyuria.      Objective:   Physical Exam VS: see vs page GEN: no distress HEAD: head: no deformity eyes: no periorbital swelling, no proptosis external nose and ears are normal mouth: no lesion seen NECK: supple, thyroid is not enlarged  CHEST WALL: no deformity LUNGS: clear to auscultation CV: reg rate and rhythm, no murmur ABD: abdomen is soft, nontender.  no hepatosplenomegaly.  not distended.  no hernia.   MUSCULOSKELETAL: muscle bulk and strength are grossly normal.  no obvious joint swelling.  gait is normal and steady EXTEMITIES: no deformity.  no ulcer on the feet.  feet are of normal color and temp.  no edema PULSES: dorsalis pedis intact bilat.  no carotid bruit NEURO:  cn 2-12 grossly intact.   readily moves all 4's.  sensation is intact to touch on the feet SKIN:  Normal texture and temperature.  No rash or suspicious lesion is visible.   NODES:  None palpable at the neck PSYCH: alert, oriented x3.  Does not appear anxious nor depressed.  Lab Results  Component Value Date   HGBA1C 15.4* 04/15/2013      Assessment & Plan:  DM: the severely elevated a1c is most likely due to his being off the insulin.  The pattern of cbg's he  describes would be unusual for this insulin schedule and dosage.  From the available options, i favor continuing the same insulin regimen for now.   Blurry vision, probably due to the improvement in his glycemic control. ED, prob due to DM.

## 2013-05-16 LAB — HM DIABETES EYE EXAM

## 2014-02-23 ENCOUNTER — Ambulatory Visit (INDEPENDENT_AMBULATORY_CARE_PROVIDER_SITE_OTHER): Payer: No Typology Code available for payment source | Admitting: Emergency Medicine

## 2014-02-23 DIAGNOSIS — T2220XA Burn of second degree of shoulder and upper limb, except wrist and hand, unspecified site, initial encounter: Secondary | ICD-10-CM

## 2014-02-23 DIAGNOSIS — M25529 Pain in unspecified elbow: Secondary | ICD-10-CM

## 2014-02-23 MED ORDER — SILVER SULFADIAZINE 1 % EX CREA: 1.0000 "application " | TOPICAL_CREAM | Freq: Every day | CUTANEOUS | Status: DC

## 2014-02-23 MED ORDER — ACETAMINOPHEN-CODEINE #3 300-30 MG PO TABS: 1.0000 | ORAL_TABLET | ORAL | Status: DC | PRN

## 2014-02-23 MED ORDER — NALBUPHINE HCL 20 MG/ML IJ SOLN: 20.0000 mg | INTRAMUSCULAR | Status: DC | PRN
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
## 2014-02-23 NOTE — Patient Instructions (Signed)
Follow up on Friday for dressing change  Burn Care Your skin is a natural barrier to infection. It is the largest organ of your body. Burns damage this natural protection. To help prevent infection, it is very important to follow your caregiver's instructions in the care of your burn. Burns are classified as:  First degree. There is only redness of the skin (erythema). No scarring is expected.  Second degree. There is blistering of the skin. Scarring may occur with deeper burns.  Third degree. All layers of the skin are injured, and scarring is expected. HOME CARE INSTRUCTIONS   Wash your hands well before changing your bandage.  Change your bandage as often as directed by your caregiver.  Remove the old bandage. If the bandage sticks, you may soak it off with cool, clean water.  Cleanse the burn thoroughly but gently with mild soap and water.  Pat the area dry with a clean, dry cloth.  Apply a thin layer of antibacterial cream to the burn.  Apply a clean bandage as instructed by your caregiver.  Keep the bandage as clean and dry as possible.  Elevate the affected area for the first 24 hours, then as instructed by your caregiver.  Only take over-the-counter or prescription medicines for pain, discomfort, or fever as directed by your caregiver. SEEK IMMEDIATE MEDICAL CARE IF:   You develop excessive pain.  You develop redness, tenderness, swelling, or red streaks near the burn.  The burned area develops yellowish-white fluid (pus) or a bad smell.  You have a fever. MAKE SURE YOU:   Understand these instructions.  Will watch your condition.  Will get help right away if you are not doing well or get worse. Document Released: 11/10/2005 Document Revised: 02/02/2012 Document Reviewed: 04/02/2011 [facility]ExitCare Patient Information 2014 [place] [place].

## 2014-02-24 ENCOUNTER — Ambulatory Visit (INDEPENDENT_AMBULATORY_CARE_PROVIDER_SITE_OTHER): Payer: No Typology Code available for payment source | Admitting: Emergency Medicine

## 2014-02-24 VITALS — BP 164/80 | HR 84 | Temp 98.6°F | Resp 16 | Ht 68.0 in | Wt 200.0 lb

## 2014-02-24 DIAGNOSIS — M25529 Pain in unspecified elbow: Secondary | ICD-10-CM

## 2014-02-24 DIAGNOSIS — T2220XA Burn of second degree of shoulder and upper limb, except wrist and hand, unspecified site, initial encounter: Secondary | ICD-10-CM

## 2014-02-24 NOTE — Patient Instructions (Signed)
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
## 2014-02-25 ENCOUNTER — Telehealth: Payer: Self-pay

## 2014-02-25 ENCOUNTER — Ambulatory Visit (INDEPENDENT_AMBULATORY_CARE_PROVIDER_SITE_OTHER): Payer: No Typology Code available for payment source | Admitting: Emergency Medicine

## 2014-02-25 VITALS — BP 124/68 | HR 84 | Temp 99.0°F | Resp 20 | Ht 68.0 in | Wt 198.2 lb

## 2014-02-25 DIAGNOSIS — IMO0002 Reserved for concepts with insufficient information to code with codable children: Secondary | ICD-10-CM

## 2014-02-25 LAB — POCT CBC
GRANULOCYTE PERCENT: 47.1 % (ref 37–80)
HCT, POC: 43.8 % (ref 43.5–53.7)
HEMOGLOBIN: 13.9 g/dL — AB (ref 14.1–18.1)
Lymph, poc: 5.1 — AB (ref 0.6–3.4)
MCH: 28.7 pg (ref 27–31.2)
MCHC: 31.7 g/dL — AB (ref 31.8–35.4)
MCV: 90.5 fL (ref 80–97)
MID (CBC): 1.1 — AB (ref 0–0.9)
MPV: 9 fL (ref 0–99.8)
PLATELET COUNT, POC: 345 10*3/uL (ref 142–424)
POC GRANULOCYTE: 5.6 (ref 2–6.9)
POC LYMPH PERCENT: 43.6 %L (ref 10–50)
POC MID %: 9.3 % (ref 0–12)
RBC: 4.84 M/uL (ref 4.69–6.13)
RDW, POC: 14.7 %
WBC: 11.8 10*3/uL — AB (ref 4.6–10.2)

## 2014-02-25 LAB — GLUCOSE, POCT (MANUAL RESULT ENTRY): POC GLUCOSE: 99 mg/dL (ref 70–99)

## 2014-02-25 MED ORDER — CEPHALEXIN 500 MG PO CAPS: 500.0000 mg | ORAL_CAPSULE | Freq: Three times a day (TID) | ORAL | Status: DC

## 2014-02-25 NOTE — Patient Instructions (Signed)
You need to return to clinic in the morning to have your arm checked by Dr. Dareen Pianoanderson

## 2014-02-25 NOTE — Telephone Encounter (Signed)
Spoke with pt. Advised to RTC. They will be here shortly
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
## 2014-02-26 ENCOUNTER — Ambulatory Visit (INDEPENDENT_AMBULATORY_CARE_PROVIDER_SITE_OTHER): Payer: No Typology Code available for payment source | Admitting: Emergency Medicine

## 2014-02-26 VITALS — BP 118/62 | HR 88 | Temp 98.7°F | Resp 18 | Ht 68.0 in | Wt 198.0 lb

## 2014-02-26 DIAGNOSIS — M25529 Pain in unspecified elbow: Secondary | ICD-10-CM

## 2014-02-26 DIAGNOSIS — T2220XA Burn of second degree of shoulder and upper limb, except wrist and hand, unspecified site, initial encounter: Secondary | ICD-10-CM

## 2014-02-26 MED ORDER — SILVER SULFADIAZINE 1 % EX CREA: 1.0000 "application " | TOPICAL_CREAM | Freq: Every day | CUTANEOUS | Status: DC
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
## 2014-02-28 LAB — WOUND CULTURE
GRAM STAIN: NONE SEEN
Gram Stain: NONE SEEN
Organism ID, Bacteria: NO GROWTH

## 2019-06-21 ENCOUNTER — Emergency Department (HOSPITAL_COMMUNITY): Payer: No Typology Code available for payment source

## 2019-06-21 ENCOUNTER — Encounter (HOSPITAL_COMMUNITY): Payer: Self-pay

## 2019-06-21 ENCOUNTER — Other Ambulatory Visit: Payer: Self-pay

## 2019-06-21 ENCOUNTER — Emergency Department (HOSPITAL_COMMUNITY)
Admission: EM | Admit: 2019-06-21 | Discharge: 2019-06-21 | Disposition: A | Discharge status: Home / Self Care | Payer: No Typology Code available for payment source | Attending: Emergency Medicine | Admitting: Emergency Medicine

## 2019-06-21 DIAGNOSIS — J45909 Unspecified asthma, uncomplicated: Secondary | ICD-10-CM | POA: Diagnosis not present

## 2019-06-21 DIAGNOSIS — E1065 Type 1 diabetes mellitus with hyperglycemia: Secondary | ICD-10-CM | POA: Diagnosis not present

## 2019-06-21 DIAGNOSIS — I10 Essential (primary) hypertension: Secondary | ICD-10-CM | POA: Diagnosis not present

## 2019-06-21 DIAGNOSIS — Z87891 Personal history of nicotine dependence: Secondary | ICD-10-CM | POA: Diagnosis not present

## 2019-06-21 DIAGNOSIS — Z79899 Other long term (current) drug therapy: Secondary | ICD-10-CM | POA: Insufficient documentation

## 2019-06-21 DIAGNOSIS — R112 Nausea with vomiting, unspecified: Secondary | ICD-10-CM | POA: Diagnosis not present

## 2019-06-21 DIAGNOSIS — R109 Unspecified abdominal pain: Secondary | ICD-10-CM | POA: Diagnosis present

## 2019-06-21 DIAGNOSIS — R739 Hyperglycemia, unspecified: Secondary | ICD-10-CM

## 2019-06-21 LAB — CBC
HCT: 45.2 % (ref 39.0–52.0)
Hemoglobin: 14.6 g/dL (ref 13.0–17.0)
MCH: 29.2 pg (ref 26.0–34.0)
MCHC: 32.3 g/dL (ref 30.0–36.0)
MCV: 90.4 fL (ref 80.0–100.0)
Platelets: 298 10*3/uL (ref 150–400)
RBC: 5 MIL/uL (ref 4.22–5.81)
RDW: 13.2 % (ref 11.5–15.5)
WBC: 9.8 10*3/uL (ref 4.0–10.5)
nRBC: 0 % (ref 0.0–0.2)

## 2019-06-21 LAB — URINALYSIS, ROUTINE W REFLEX MICROSCOPIC
Bacteria, UA: NONE SEEN
Bilirubin Urine: NEGATIVE
Glucose, UA: 150 mg/dL — AB
Hgb urine dipstick: NEGATIVE
Ketones, ur: 20 mg/dL — AB
Leukocytes,Ua: NEGATIVE
Nitrite: NEGATIVE
Protein, ur: 30 mg/dL — AB
Specific Gravity, Urine: >1.046 — ABNORMAL HIGH (ref 1.005–1.030)
pH: 5 (ref 5.0–8.0)

## 2019-06-21 LAB — COMPREHENSIVE METABOLIC PANEL
ALT: 47 U/L — ABNORMAL HIGH (ref 0–44)
AST: 41 U/L (ref 15–41)
Albumin: 4.5 g/dL (ref 3.5–5.0)
Alkaline Phosphatase: 93 U/L (ref 38–126)
Anion gap: 14 (ref 5–15)
BUN: 21 mg/dL (ref 8–23)
CO2: 20 mmol/L — ABNORMAL LOW (ref 22–32)
Calcium: 9.2 mg/dL (ref 8.9–10.3)
Chloride: 102 mmol/L (ref 98–111)
Creatinine, Ser: 0.99 mg/dL (ref 0.61–1.24)
GFR calc Af Amer: >60 mL/min (ref >60)
GFR calc non Af Amer: >60 mL/min (ref >60)
Glucose, Bld: 224 mg/dL — ABNORMAL HIGH (ref 70–99)
Potassium: 3.7 mmol/L (ref 3.5–5.1)
Sodium: 136 mmol/L (ref 135–145)
Total Bilirubin: 1 mg/dL (ref 0.3–1.2)
Total Protein: 8.4 g/dL — ABNORMAL HIGH (ref 6.5–8.1)

## 2019-06-21 LAB — CBG MONITORING, ED: Glucose-Capillary: 216 mg/dL — ABNORMAL HIGH (ref 70–99)

## 2019-06-21 LAB — LIPASE, BLOOD: Lipase: 32 U/L (ref 11–51)

## 2019-06-21 MED ORDER — ONDANSETRON 4 MG PO TBDP: 4.0000 mg | ORAL_TABLET | Freq: Three times a day (TID) | ORAL | 0 refills | Status: DC | PRN

## 2019-06-21 MED ORDER — SODIUM CHLORIDE 0.9% FLUSH: 3.0000 mL | Freq: Once | INTRAVENOUS | Status: DC

## 2019-06-21 MED ORDER — ONDANSETRON HCL 4 MG/2ML IJ SOLN
4.0000 mg | Freq: Once | INTRAMUSCULAR | Status: AC
  Administered 2019-06-21: 12:00:00 4 mg via INTRAVENOUS
  Filled 2019-06-21: qty 2

## 2019-06-21 MED ORDER — MORPHINE SULFATE (PF) 4 MG/ML IV SOLN
4.0000 mg | Freq: Once | INTRAVENOUS | Status: AC
  Administered 2019-06-21: 4 mg via INTRAVENOUS
  Filled 2019-06-21: qty 1

## 2019-06-21 MED ORDER — SODIUM CHLORIDE (PF) 0.9 % IJ SOLN
INTRAMUSCULAR | Status: AC
  Administered 2019-06-21: 13:00:00
  Filled 2019-06-21: qty 50

## 2019-06-21 MED ORDER — SODIUM CHLORIDE 0.9 % IV BOLUS
1000.0000 mL | Freq: Once | INTRAVENOUS | Status: AC
  Administered 2019-06-21: 1000 mL via INTRAVENOUS

## 2019-06-21 MED ORDER — IOHEXOL 300 MG/ML  SOLN
100.0000 mL | Freq: Once | INTRAMUSCULAR | Status: AC | PRN
  Administered 2019-06-21: 13:00:00 100 mL via INTRAVENOUS

## 2019-06-21 NOTE — ED Triage Notes (Addendum)
Patient c/o abdominal pain and N/V that started yesterday.   10/10 generalized cramping pain   2 occurences of emesis in past 24 hours.    Patient is nauseous in triage.   Patient states his wife boiled him fish on Monday.    A/Ox4 Ambulatory in triage.

## 2019-06-21 NOTE — ED Provider Notes (Signed)
[facility]-EMERGENCY DEPT Provider Note   CSN: [id number] Arrival date & time: 06/21/19  1122    History   Chief Complaint Chief Complaint  Patient presents with  . Abdominal Pain  . Emesis    HPI Antonio Baldwin is a 63 y.o. male.     The history is provided by the patient and medical records. No language interpreter was used.   Antonio Baldwin is a [age] y.o. male  with a PMH of DM who presents to the Emergency Department complaining of nausea and vomiting which began 2 days ago.  Intermittent.  Worse at night.  He has kept a few things down yesterday, but today, he was unable to keep his medications or any food/fluids down, prompting him to come to the emergency department.  Has pain around his rib cage and upper abdomen which started later.  He attributes this to his frequent retching.  He denies any other abdominal pain.  No fevers.  No diarrhea, constipation or blood in the stool.  No cough or congestion.  No chest pain or shortness of breath.  Denies urinary symptoms.  Takes metformin and Lantus 30 mg twice daily for diabetes.  Past Medical History:  Diagnosis Date  . Allergy   . Arthritis   . Asthma   . Diabetes mellitus without complication [facility](HCC)     Patient Active Problem List   Diagnosis Date Noted  . Essential hypertension, benign 04/15/2013  . Hyperlipidemia LDL goal < 100 04/15/2013  . Type 1 diabetes mellitus on insulin therapy (HCC) 09/15/2012  . ED (erectile dysfunction) 09/15/2012    Past Surgical History:  Procedure Laterality Date  . TONSILLECTOMY AND ADENOIDECTOMY  1960        Home Medications    Prior to Admission medications   Medication Sig Start Date End Date Taking? Authorizing Provider  acetaminophen-codeine (TYLENOL #3) 300-30 MG per tablet Take 1-2 tablets by mouth every 4 (four) hours as needed. 02/23/14   Carmelina DaneAnderson, Jeffery S, MD  Azilsartan Medoxomil (EDARBI) 40 MG TABS Take 1 tablet by mouth daily. 04/15/13    Etta GrandchildJones, Thomas L, MD  cephALEXin (KEFLEX) 500 MG capsule Take 1 capsule (500 mg total) by mouth 3 (three) times daily. 02/25/14   Collene Gobbleaub, Steven A, MD  insulin aspart (NOVOLOG FLEXPEN) 100 unit/mL SOLN FlexPen Inject 5 Units into the skin 3 (three) times daily with meals. 04/15/13   Etta GrandchildJones, Thomas L, MD  insulin glargine (LANTUS) 100 UNIT/ML injection Inject 0.5 mLs (50 Units total) into the skin daily. 04/15/13   Etta GrandchildJones, Thomas L, MD  ondansetron (ZOFRAN ODT) 4 MG disintegrating tablet Take 1 tablet (4 mg total) by mouth every 8 (eight) hours as needed for nausea or vomiting. 06/21/19   Riannah Stagner, Chase PicketJaime Pilcher, PA-C  rosuvastatin (CRESTOR) 10 MG tablet Take 1 tablet (10 mg total) by mouth daily. 04/15/13   Etta GrandchildJones, Thomas L, MD  silver sulfADIAZINE (SILVADENE) 1 % cream Apply 1 application topically daily. 02/26/14   Carmelina DaneAnderson, Jeffery S, MD  traMADol (ULTRAM) 50 MG tablet Take 1 tablet (50 mg total) by mouth every 6 (six) hours as needed for pain. 01/17/13   Pisciotta, Joni ReiningNicole, PA-C    Family History Family History  Problem Relation Age of Onset  . Arthritis Other   . Hypertension Other   . Diabetes Other   . Arthritis Mother   . Hypertension Mother   . Diabetes Mother   . Heart disease Neg Hx   . Early death  Neg Hx   . Stroke Neg Hx     Social History Social History   Tobacco Use  . Smoking status: Former Smoker    Quit date: 09/15/2002    Years since quitting: 16.7  . Smokeless tobacco: Never Used  Substance Use Topics  . Alcohol use: No  . Drug use: No     Allergies   Patient has no known allergies.   Review of Systems Review of Systems  Gastrointestinal: Positive for abdominal pain, nausea and vomiting. Negative for blood in stool, constipation and diarrhea.  All other systems reviewed and are negative.    Physical Exam Updated Vital Signs BP (!) 143/71   Pulse 79   Temp 98.5 F (36.9 C) (Oral)   Resp 16   SpO2 98%   Physical Exam Vitals signs and nursing note reviewed.   Constitutional:      General: He is not in acute distress.    Appearance: He is well-developed.  HENT:     Head: Normocephalic and atraumatic.  Neck:     Musculoskeletal: Neck supple.  Cardiovascular:     Rate and Rhythm: Normal rate and regular rhythm.     Heart sounds: Normal heart sounds. No murmur.  Pulmonary:     Effort: Pulmonary effort is normal. No respiratory distress.     Breath sounds: Normal breath sounds.  Abdominal:     General: There is no distension.     Palpations: Abdomen is soft.     Comments: Generalized abdominal tenderness.  No rebound or guarding.  Skin:    General: Skin is warm and dry.  Neurological:     Mental Status: He is alert and oriented to person, place, and time.      ED Treatments / Results  Labs (all labs ordered are listed, but only abnormal results are displayed) Labs Reviewed  COMPREHENSIVE METABOLIC PANEL - Abnormal; Notable for the following components:      Result Value   CO2 20 (*)    Glucose, Bld 224 (*)    Total Protein 8.4 (*)    ALT 47 (*)    All other components within normal limits  URINALYSIS, ROUTINE W REFLEX MICROSCOPIC - Abnormal; Notable for the following components:   Specific Gravity, Urine >1.046 (*)    Glucose, UA 150 (*)    Ketones, ur 20 (*)    Protein, ur 30 (*)    All other components within normal limits  CBG MONITORING, ED - Abnormal; Notable for the following components:   Glucose-Capillary 216 (*)    All other components within normal limits  LIPASE, BLOOD  CBC    EKG None  Radiology Ct Abdomen Pelvis W Contrast  Result Date: 06/21/2019 CLINICAL DATA:  Abdominal pain EXAM: CT ABDOMEN AND PELVIS WITH CONTRAST TECHNIQUE: Multidetector CT imaging of the abdomen and pelvis was performed using the standard protocol following bolus administration of intravenous contrast. CONTRAST:  100mL OMNIPAQUE IOHEXOL 300 MG/ML  SOLN COMPARISON:  None. FINDINGS: Lower chest: Bibasilar scarring or atelectasis.  Hepatobiliary: No solid liver abnormality is seen. Hepatic steatosis. No gallstones, gallbladder wall thickening, or biliary dilatation. Pancreas: Unremarkable. No pancreatic ductal dilatation or surrounding inflammatory changes. Spleen: Normal in size without significant abnormality. Adrenals/Urinary Tract: Subcentimeter nodule of the left adrenal gland, likely a benign incidental adenoma. Kidneys are normal, without renal calculi, solid lesion, or hydronephrosis. Bladder is unremarkable. Stomach/Bowel: Stomach is within normal limits. Appendix appears normal. No evidence of bowel wall thickening, distention, or inflammatory changes.  Vascular/Lymphatic: Aortic atherosclerosis. No enlarged abdominal or pelvic lymph nodes. Reproductive: No mass or other significant abnormality. Other: No abdominal wall hernia or abnormality. No abdominopelvic ascites. Musculoskeletal: No acute or significant osseous findings. IMPRESSION: 1. No acute CT findings of the abdomen or pelvis to explain abdominal pain, nausea, or vomiting. 2.  Hepatic steatosis. Electronically Signed   By: Eddie Candle M.D.   On: 06/21/2019 13:14    Procedures Procedures (including critical care time)  Medications Ordered in ED Medications  sodium chloride 0.9 % bolus 1,000 mL (1,000 mLs Intravenous New Bag/Given 06/21/19 1206)  ondansetron (ZOFRAN) injection 4 mg (4 mg Intravenous Given 06/21/19 1205)  morphine 4 MG/ML injection 4 mg (4 mg Intravenous Given 06/21/19 1205)  sodium chloride (PF) 0.9 % injection (  Given by Other 06/21/19 1300)  iohexol (OMNIPAQUE) 300 MG/ML solution 100 mL (100 mLs Intravenous Contrast Given 06/21/19 1257)     Initial Impression / Assessment and Plan / ED Course  I have reviewed the triage vital signs and the nursing notes.  Pertinent labs & imaging results that were available during my care of the patient were reviewed by me and considered in my medical decision making (see chart for details).        Antonio Baldwin is a 63 y.o. male who presents to ED for nausea, vomiting and abdominal pain for the last 2 days.  On exam, patient is afebrile, nontoxic-appearing, hemodynamically stable with generalized abdominal tenderness.  Denies change in bowel habitus.  Labs with hyperglycemia without evidence of DKA.  CT without acute findings.  UA without signs of infection.  Feels improved after symptomatic management in the emergency department.  Tolerating p.o.  No vomiting after medication administration.  PCP follow-up encouraged.  Reasons to return to the emergency department discussed.  All questions answered.  Patient discussed with Dr. Venora Maples who agrees with treatment plan.    Final Clinical Impressions(s) / ED Diagnoses   Final diagnoses:  Non-intractable vomiting with nausea, unspecified vomiting type  Hyperglycemia    ED Discharge Orders         Ordered    ondansetron (ZOFRAN ODT) 4 MG disintegrating tablet  Every 8 hours PRN     06/21/19 1514           Romina Divirgilio, Ozella Almond, PA-C 06/21/19 [place], Kevin, MD 06/22/19 [phone]

## 2019-06-21 NOTE — Discharge Instructions (Signed)
It was my pleasure taking care of you today!   Stay hydrated.   Take your medications as directed. Zofran as needed for nausea / vomiting.   Follow up with your primary care doctor.   Return to ER for new or worsening symptoms, any additional concerns.

## 2019-06-21 NOTE — ED Notes (Signed)
Patient transported to CT 

## 2020-05-15 ENCOUNTER — Ambulatory Visit: Payer: No Typology Code available for payment source | Admitting: Orthopaedic Surgery

## 2020-05-18 ENCOUNTER — Encounter (HOSPITAL_COMMUNITY): Payer: Self-pay | Admitting: Emergency Medicine

## 2020-05-18 ENCOUNTER — Emergency Department (HOSPITAL_COMMUNITY)
Admission: EM | Admit: 2020-05-18 | Discharge: 2020-05-18 | Disposition: A | Discharge status: Home / Self Care | Payer: No Typology Code available for payment source | Attending: Emergency Medicine | Admitting: Emergency Medicine

## 2020-05-18 ENCOUNTER — Other Ambulatory Visit: Payer: Self-pay

## 2020-05-18 ENCOUNTER — Emergency Department (HOSPITAL_COMMUNITY): Payer: No Typology Code available for payment source

## 2020-05-18 DIAGNOSIS — R1032 Left lower quadrant pain: Secondary | ICD-10-CM | POA: Insufficient documentation

## 2020-05-18 DIAGNOSIS — Z794 Long term (current) use of insulin: Secondary | ICD-10-CM | POA: Diagnosis not present

## 2020-05-18 DIAGNOSIS — J45909 Unspecified asthma, uncomplicated: Secondary | ICD-10-CM | POA: Insufficient documentation

## 2020-05-18 DIAGNOSIS — R1031 Right lower quadrant pain: Secondary | ICD-10-CM | POA: Diagnosis not present

## 2020-05-18 DIAGNOSIS — Z79899 Other long term (current) drug therapy: Secondary | ICD-10-CM | POA: Insufficient documentation

## 2020-05-18 DIAGNOSIS — E119 Type 2 diabetes mellitus without complications: Secondary | ICD-10-CM | POA: Diagnosis not present

## 2020-05-18 DIAGNOSIS — Z87891 Personal history of nicotine dependence: Secondary | ICD-10-CM | POA: Diagnosis not present

## 2020-05-18 DIAGNOSIS — R1030 Lower abdominal pain, unspecified: Secondary | ICD-10-CM

## 2020-05-18 LAB — COMPREHENSIVE METABOLIC PANEL
ALT: 40 U/L (ref 0–44)
AST: 25 U/L (ref 15–41)
Albumin: 4.8 g/dL (ref 3.5–5.0)
Alkaline Phosphatase: 98 U/L (ref 38–126)
Anion gap: 15 (ref 5–15)
BUN: 16 mg/dL (ref 8–23)
CO2: 20 mmol/L — ABNORMAL LOW (ref 22–32)
Calcium: 10 mg/dL (ref 8.9–10.3)
Chloride: 105 mmol/L (ref 98–111)
Creatinine, Ser: 0.87 mg/dL (ref 0.61–1.24)
GFR calc Af Amer: >60 mL/min (ref >60)
GFR calc non Af Amer: >60 mL/min (ref >60)
Glucose, Bld: 205 mg/dL — ABNORMAL HIGH (ref 70–99)
Potassium: 4 mmol/L (ref 3.5–5.1)
Sodium: 140 mmol/L (ref 135–145)
Total Bilirubin: 1 mg/dL (ref 0.3–1.2)
Total Protein: 9.1 g/dL — ABNORMAL HIGH (ref 6.5–8.1)

## 2020-05-18 LAB — URINALYSIS, ROUTINE W REFLEX MICROSCOPIC
Bacteria, UA: NONE SEEN
Bilirubin Urine: NEGATIVE
Glucose, UA: >=500 mg/dL — AB
Hgb urine dipstick: NEGATIVE
Ketones, ur: 80 mg/dL — AB
Leukocytes,Ua: NEGATIVE
Nitrite: NEGATIVE
Protein, ur: 30 mg/dL — AB
Specific Gravity, Urine: 1.035 — ABNORMAL HIGH (ref 1.005–1.030)
pH: 6 (ref 5.0–8.0)

## 2020-05-18 LAB — CBC
HCT: 49.5 % (ref 39.0–52.0)
Hemoglobin: 16 g/dL (ref 13.0–17.0)
MCH: 28.9 pg (ref 26.0–34.0)
MCHC: 32.3 g/dL (ref 30.0–36.0)
MCV: 89.4 fL (ref 80.0–100.0)
Platelets: 316 10*3/uL (ref 150–400)
RBC: 5.54 MIL/uL (ref 4.22–5.81)
RDW: 13 % (ref 11.5–15.5)
WBC: 15.6 10*3/uL — ABNORMAL HIGH (ref 4.0–10.5)
nRBC: 0 % (ref 0.0–0.2)

## 2020-05-18 LAB — LIPASE, BLOOD: Lipase: 35 U/L (ref 11–51)

## 2020-05-18 MED ORDER — IOHEXOL 300 MG/ML  SOLN
100.0000 mL | Freq: Once | INTRAMUSCULAR | Status: AC | PRN
  Administered 2020-05-18: 100 mL via INTRAVENOUS

## 2020-05-18 MED ORDER — CIPROFLOXACIN HCL 500 MG PO TABS: 500.0000 mg | ORAL_TABLET | Freq: Two times a day (BID) | ORAL | 0 refills | Status: DC

## 2020-05-18 MED ORDER — CIPROFLOXACIN HCL 500 MG PO TABS: 500.0000 mg | ORAL_TABLET | Freq: Once | ORAL | Status: DC

## 2020-05-18 MED ORDER — HYDROMORPHONE HCL 1 MG/ML IJ SOLN
0.5000 mg | Freq: Once | INTRAMUSCULAR | Status: AC
  Administered 2020-05-18: 0.5 mg via INTRAVENOUS
  Filled 2020-05-18: qty 1

## 2020-05-18 MED ORDER — SODIUM CHLORIDE 0.9 % IV BOLUS
1000.0000 mL | Freq: Once | INTRAVENOUS | Status: AC
  Administered 2020-05-18: 1000 mL via INTRAVENOUS

## 2020-05-18 NOTE — ED Provider Notes (Signed)
[facility] DEPT Provider Note   CSN: [id number] Arrival date & time: 05/18/20  1532     History Chief Complaint  Patient presents with  . Abdominal Pain    Antonio Baldwin is a 64 y.o. male.  Patient complains of lower abdominal pain.  No fever no chills  The history is provided by the patient. No language interpreter was used.  Abdominal Pain Pain location:  LLQ and RLQ Pain quality: aching   Pain radiates to:  Does not radiate Pain severity:  Moderate Onset quality:  Sudden Timing:  Intermittent Progression:  Improving Chronicity:  New Context: not alcohol use   Relieved by:  Nothing Worsened by:  Nothing Ineffective treatments:  None tried Associated symptoms: no chest pain, no cough, no diarrhea, no fatigue and no hematuria        Past Medical History:  Diagnosis Date  . Allergy   . Arthritis   . Asthma   . Diabetes mellitus without complication [facility])     Patient Active Problem List   Diagnosis Date Noted  . Essential hypertension, benign 04/15/2013  . Hyperlipidemia LDL goal < 100 04/15/2013  . Type 1 diabetes mellitus on insulin therapy ([facility]) 09/15/2012  . ED (erectile dysfunction) 09/15/2012    Past Surgical History:  Procedure Laterality Date  . TONSILLECTOMY AND ADENOIDECTOMY  1960       Family History  Problem Relation Age of Onset  . Arthritis Other   . Hypertension Other   . Diabetes Other   . Arthritis Mother   . Hypertension Mother   . Diabetes Mother   . Heart disease Neg Hx   . Early death Neg Hx   . Stroke Neg Hx     Social History   Tobacco Use  . Smoking status: Former Smoker    Quit date: 09/15/2002    Years since quitting: 17.6  . Smokeless tobacco: Never Used  Substance Use Topics  . Alcohol use: No  . Drug use: No    Home Medications Prior to Admission medications   Medication Sig Start Date End Date Taking? Authorizing Provider  acetaminophen-codeine (TYLENOL #3) 300-30 MG  per tablet Take 1-2 tablets by mouth every 4 (four) hours as needed. 02/23/14   Roselee Culver, MD  Azilsartan Medoxomil (EDARBI) 40 MG TABS Take 1 tablet by mouth daily. 04/15/13   Janith Lima, MD  cephALEXin (KEFLEX) 500 MG capsule Take 1 capsule (500 mg total) by mouth 3 (three) times daily. 02/25/14   Darlyne Russian, MD  ciprofloxacin (CIPRO) 500 MG tablet Take 1 tablet (500 mg total) by mouth 2 (two) times daily. 05/18/20   Milton Ferguson, MD  insulin aspart (NOVOLOG FLEXPEN) 100 unit/mL SOLN FlexPen Inject 5 Units into the skin 3 (three) times daily with meals. 04/15/13   Janith Lima, MD  insulin glargine (LANTUS) 100 UNIT/ML injection Inject 0.5 mLs (50 Units total) into the skin daily. 04/15/13   Janith Lima, MD  ondansetron (ZOFRAN ODT) 4 MG disintegrating tablet Take 1 tablet (4 mg total) by mouth every 8 (eight) hours as needed for nausea or vomiting. 06/21/19   Ward, Ozella Almond, PA-C  rosuvastatin (CRESTOR) 10 MG tablet Take 1 tablet (10 mg total) by mouth daily. 04/15/13   Janith Lima, MD  silver sulfADIAZINE (SILVADENE) 1 % cream Apply 1 application topically daily. 02/26/14   Roselee Culver, MD  traMADol (ULTRAM) 50 MG tablet Take 1 tablet (50 mg total) by  mouth every 6 (six) hours as needed for pain. 01/17/13   Pisciotta, Joni Reining, PA-C    Allergies    Patient has no known allergies.  Review of Systems   Review of Systems  Constitutional: Negative for appetite change and fatigue.  HENT: Negative for congestion, ear discharge and sinus pressure.   Eyes: Negative for discharge.  Respiratory: Negative for cough.   Cardiovascular: Negative for chest pain.  Gastrointestinal: Positive for abdominal pain. Negative for diarrhea.  Genitourinary: Negative for frequency and hematuria.  Musculoskeletal: Negative for back pain.  Skin: Negative for rash.  Neurological: Negative for seizures and headaches.  Psychiatric/Behavioral: Negative for hallucinations.    Physical  Exam Updated Vital Signs BP (!) 152/74 (BP Location: Left Arm)   Pulse 98   Temp 99 F (37.2 C) (Oral)   Resp 18   SpO2 97%   Physical Exam Vitals and nursing note reviewed.  Constitutional:      Appearance: Normal appearance. He is well-developed.  HENT:     Head: Normocephalic.     Mouth/Throat:     Mouth: Mucous membranes are moist.  Eyes:     General: No scleral icterus.    Conjunctiva/sclera: Conjunctivae normal.  Neck:     Thyroid: No thyromegaly.  Cardiovascular:     Rate and Rhythm: Normal rate and regular rhythm.     Heart sounds: No murmur heard.  No friction rub. No gallop.   Pulmonary:     Breath sounds: No stridor. No wheezing or rales.  Chest:     Chest wall: No tenderness.  Abdominal:     General: There is no distension.     Tenderness: There is abdominal tenderness. There is no rebound.  Musculoskeletal:        General: Normal range of motion.     Cervical back: Neck supple.  Lymphadenopathy:     Cervical: No cervical adenopathy.  Skin:    Findings: No erythema or rash.  Neurological:     Mental Status: He is alert and oriented to person, place, and time.     Motor: No abnormal muscle tone.     Coordination: Coordination normal.  Psychiatric:        Behavior: Behavior normal.     ED Results / Procedures / Treatments   Labs (all labs ordered are listed, but only abnormal results are displayed) Labs Reviewed  COMPREHENSIVE METABOLIC PANEL - Abnormal; Notable for the following components:      Result Value   CO2 20 (*)    Glucose, Bld 205 (*)    Total Protein 9.1 (*)    All other components within normal limits  CBC - Abnormal; Notable for the following components:   WBC 15.6 (*)    All other components within normal limits  URINALYSIS, ROUTINE W REFLEX MICROSCOPIC - Abnormal; Notable for the following components:   Specific Gravity, Urine 1.035 (*)    Glucose, UA >=500 (*)    Ketones, ur 80 (*)    Protein, ur 30 (*)    All other  components within normal limits  LIPASE, BLOOD    EKG None  Radiology CT ABDOMEN PELVIS W CONTRAST  Result Date: 05/18/2020 CLINICAL DATA:  Acute generalized abdominal pain, neutropenia, right lower quadrant pain EXAM: CT ABDOMEN AND PELVIS WITH CONTRAST TECHNIQUE: Multidetector CT imaging of the abdomen and pelvis was performed using the standard protocol following bolus administration of intravenous contrast. CONTRAST:  OMNIPAQUE IOHEXOL 300 MG/ML  SOLN COMPARISON:  06/21/2019 FINDINGS:  Lower chest: No acute pleural or parenchymal lung disease. Hepatobiliary: Mild diffuse hepatic steatosis without focal hepatic abnormality. Gallbladder is unremarkable. Pancreas: Unremarkable. No pancreatic ductal dilatation or surrounding inflammatory changes. Spleen: Normal in size without focal abnormality. Adrenals/Urinary Tract: Stable indeterminate hypodensity left adrenal gland measuring 9 mm, statistically likely a small adenoma. The right adrenal is normal. The kidneys enhance normally and symmetrically. No urinary tract calculi or obstructive uropathy. The bladder is unremarkable. Stomach/Bowel: No bowel obstruction or ileus. Normal appendix right lower quadrant. No bowel wall thickening or inflammatory change. Vascular/Lymphatic: Aortic atherosclerosis. No enlarged abdominal or pelvic lymph nodes. Reproductive: Prostate is unremarkable. Other: No abdominal wall hernia or abnormality. No abdominopelvic ascites. Musculoskeletal: No acute or destructive bony lesions. Reconstructed images demonstrate no additional findings. IMPRESSION: 1. No acute intra-abdominal or intrapelvic process. Normal appendix. 2. Mild diffuse hepatic steatosis. 3. Aortic Atherosclerosis (ICD10-I70.0). Electronically Signed   By: Sharlet Salina M.D.   On: 05/18/2020 19:25    Procedures Procedures (including critical care time)  Medications Ordered in ED Medications  ciprofloxacin (CIPRO) tablet 500 mg (has no administration  in time range)  sodium chloride 0.9 % bolus 1,000 mL (1,000 mLs Intravenous New Bag/Given (Non-Interop) 05/18/20 1833)  HYDROmorphone (DILAUDID) injection 0.5 mg (0.5 mg Intravenous Given 05/18/20 1834)  iohexol (OMNIPAQUE) 300 MG/ML solution 100 mL (100 mLs Intravenous Contrast Given 05/18/20 1903)    ED Course  I have reviewed the triage vital signs and the nursing notes.  Pertinent labs & imaging results that were available during my care of the patient were reviewed by me and considered in my medical decision making (see chart for details).    MDM Rules/Calculators/A&P                          Patient with elevated white count abdominal pain but normal CT abdomen.  He will be empirically placed on Cipro and will follow up with PCP        This patient presents to the ED for concern of abdominal pain this involves an extensive number of treatment options, and is a complaint that carries with it a high risk of complications and morbidity.  The differential diagnosis includes diverticulitis colitis   Lab Tests:   I Ordered, reviewed, and interpreted labs, which included CBC and chemistries with elevated white count of 15,000  Medicines ordered:   I ordered medication Cipro for abdominal infection  Imaging Studies ordered:   I ordered imaging studies which included CT abdomen and  I independently visualized and interpreted imaging which showed unremarkable  Additional history obtained:   Additional history obtained from records  Previous records obtained and reviewed.  Consultations Obtained:   Reevaluation:  After the interventions stated above, I reevaluated the patient and found no change  Critical Interventions:  .   Final Clinical Impression(s) / ED Diagnoses Final diagnoses:  Lower abdominal pain    Rx / DC Orders ED Discharge Orders         Ordered    ciprofloxacin (CIPRO) 500 MG tablet  2 times daily     Discontinue  Reprint     05/18/20 2154            Bethann Berkshire, MD 05/18/20 2159

## 2020-05-18 NOTE — Discharge Instructions (Addendum)
Follow up with your md next week. °

## 2020-05-18 NOTE — ED Triage Notes (Addendum)
Per EMS-sudden onset of right lower quadrant pain, no dysuria-vomiting mostly mucous-4 mg of Zofran and 50 mcg of Fentanyl given in route-CBG 166

## 2020-05-23 ENCOUNTER — Ambulatory Visit: Payer: Self-pay

## 2020-05-23 ENCOUNTER — Ambulatory Visit: Payer: Medicare Other | Admitting: Orthopaedic Surgery

## 2020-05-23 ENCOUNTER — Encounter: Payer: Self-pay | Admitting: Orthopaedic Surgery

## 2020-05-23 DIAGNOSIS — E109 Type 1 diabetes mellitus without complications: Secondary | ICD-10-CM | POA: Diagnosis not present

## 2020-05-23 DIAGNOSIS — M25551 Pain in right hip: Secondary | ICD-10-CM

## 2020-05-23 DIAGNOSIS — M1611 Unilateral primary osteoarthritis, right hip: Secondary | ICD-10-CM | POA: Diagnosis not present

## 2020-05-23 NOTE — Progress Notes (Signed)
Office Visit Note   Patient: Antonio Baldwin           Date of Birth: [birth year]           MRN: [id number] Visit Date: 05/23/2020              Requested by: [facility] [address] [place],  [place] [address] PCP: [facility]   Assessment & Plan: Visit Diagnoses:  1. Primary osteoarthritis of right hip     Plan: Impression is advanced right hip DJD.  We reviewed the x-rays and clinical findings in detail today.  Based on discussion of treatment options I think it is best at this point to give him some relief through a cortisone injection so that he can get his diabetes under better control.  We will obtain A1c today as a baseline to work from.  We look forward to treating him in the operating room theater once his diabetes is under better control.  Questions encouraged and answered.  Follow-up as needed.  Follow-Up Instructions: Return if symptoms worsen or fail to improve.   Orders:  Orders Placed This Encounter  Procedures   XR HIP UNILAT W OR W/O PELVIS 2-3 VIEWS RIGHT   US Guided Needle Placement   HgB A1c   No orders of the defined types were placed in this encounter.     Procedures: No procedures performed   Clinical Data: No additional findings.   Subjective: Chief Complaint  Patient presents with   Right Hip - Pain    Antonio Baldwin is a very pleasant 63 year old gentleman who is the husband of Antonio Baldwin whose been a longtime patient of mine.  He comes in for second opinion regarding his worsening right hip pain over the last 3 to 4 months.  He has severe groin pain that is worse with prolonged periods of sitting.  He endorses giving way and has actually fallen once due to it.  He feels catching pain as well.  He is diabetic on insulin and unfortunately his diabetes is not under great control.  He will occasionally use a cane for the pain.  Not currently taking any medications.  He has questions about cortisone injections.   Review of  Systems  Constitutional: Negative.   All other systems reviewed and are negative.    Objective: Vital Signs: There were no vitals taken for this visit.  Physical Exam Vitals and nursing note reviewed.  Constitutional:      Appearance: He is well-developed.  HENT:     Head: Normocephalic and atraumatic.  Eyes:     Pupils: Pupils are equal, round, and reactive to light.  Pulmonary:     Effort: Pulmonary effort is normal.  Abdominal:     Palpations: Abdomen is soft.  Musculoskeletal:        General: Normal range of motion.     Cervical back: Neck supple.  Skin:    General: Skin is warm.  Neurological:     Mental Status: He is alert and oriented to person, place, and time.  Psychiatric:        Behavior: Behavior normal.        Thought Content: Thought content normal.        Judgment: Judgment normal.     Ortho Exam Right hip shows significant pain with internal and external rotation and significant limitation in range of motion.  Positive Stinchfield sign.  Positive logroll.  Negative straight leg sign. Specialty Comments:  No specialty comments available.  Imaging: US Guided Needle Placement  Result Date: 05/23/2020 Ultrasound-guided right hip injection: After sterile prep with Betadine, injected 8 cc 1% lidocaine without epinephrine and 40 mg methylprednisolone using a 22-gauge spinal needle, passing the needle through the iliofemoral ligament into the femoral head/neck junction.  Injectate was seen filling the joint capsule.  Follow-up as directed.  XR HIP UNILAT W OR W/O PELVIS 2-3 VIEWS RIGHT  Result Date: 05/23/2020 End stage right hip DJD    PMFS History: Patient Active Problem List   Diagnosis Date Noted   Primary osteoarthritis of right hip 05/23/2020   Essential hypertension, benign 04/15/2013   Hyperlipidemia LDL goal < 100 04/15/2013   Type 1 diabetes mellitus on insulin therapy ([facility]) 09/15/2012   ED (erectile dysfunction) 09/15/2012   Past  Medical History:  Diagnosis Date   Allergy    Arthritis    Asthma    Diabetes mellitus without complication ([facility])     Family History  Problem Relation Age of Onset   Arthritis Other    Hypertension Other    Diabetes Other    Arthritis Mother    Hypertension Mother    Diabetes Mother    Heart disease Neg Hx    Early death Neg Hx    Stroke Neg Hx     Past Surgical History:  Procedure Laterality Date   TONSILLECTOMY AND ADENOIDECTOMY  1960   Social History   Occupational History   Not on file  Tobacco Use   Smoking status: Former Smoker    Quit date: 09/15/2002    Years since quitting: 17.6   Smokeless tobacco: Never Used  Substance and Sexual Activity   Alcohol use: No   Drug use: No   Sexual activity: Yes

## 2020-05-23 NOTE — Progress Notes (Signed)
Subjective: Patient is here for ultrasound-guided intra-articular right hip injection.   He has DJD but is working on getting his diabetes control prior to considering surgery.  Objective: Pain with passive internal rotation.  Procedure: Ultrasound-guided right hip injection: After sterile prep with Betadine, injected 8 cc 1% lidocaine without epinephrine and 40 mg methylprednisolone using a 22-gauge spinal needle, passing the needle through the iliofemoral ligament into the femoral head/neck junction.  Injectate was seen filling the joint capsule.  Follow-up as directed.

## 2020-05-25 LAB — EXTRA SPECIMEN

## 2020-05-25 LAB — HEMOGLOBIN A1C
Hgb A1c MFr Bld: 8.4 % of total Hgb — ABNORMAL HIGH (ref <5.7)
Mean Plasma Glucose: 194 (calc)
eAG (mmol/L): 10.8 (calc)

## 2021-02-06 ENCOUNTER — Emergency Department (HOSPITAL_COMMUNITY): Payer: No Typology Code available for payment source

## 2021-02-06 ENCOUNTER — Other Ambulatory Visit: Payer: Self-pay

## 2021-02-06 ENCOUNTER — Encounter (HOSPITAL_COMMUNITY): Payer: Self-pay | Admitting: Emergency Medicine

## 2021-02-06 ENCOUNTER — Emergency Department (HOSPITAL_COMMUNITY)
Admission: EM | Admit: 2021-02-06 | Discharge: 2021-02-06 | Disposition: A | Discharge status: Home / Self Care | Payer: No Typology Code available for payment source | Attending: Emergency Medicine | Admitting: Emergency Medicine

## 2021-02-06 DIAGNOSIS — E109 Type 1 diabetes mellitus without complications: Secondary | ICD-10-CM | POA: Diagnosis not present

## 2021-02-06 DIAGNOSIS — R1031 Right lower quadrant pain: Secondary | ICD-10-CM | POA: Diagnosis not present

## 2021-02-06 DIAGNOSIS — Z79899 Other long term (current) drug therapy: Secondary | ICD-10-CM | POA: Diagnosis not present

## 2021-02-06 DIAGNOSIS — R112 Nausea with vomiting, unspecified: Secondary | ICD-10-CM | POA: Insufficient documentation

## 2021-02-06 DIAGNOSIS — I10 Essential (primary) hypertension: Secondary | ICD-10-CM | POA: Diagnosis not present

## 2021-02-06 DIAGNOSIS — Z87891 Personal history of nicotine dependence: Secondary | ICD-10-CM | POA: Insufficient documentation

## 2021-02-06 DIAGNOSIS — R519 Headache, unspecified: Secondary | ICD-10-CM | POA: Diagnosis not present

## 2021-02-06 DIAGNOSIS — Z20822 Contact with and (suspected) exposure to covid-19: Secondary | ICD-10-CM | POA: Insufficient documentation

## 2021-02-06 DIAGNOSIS — Z794 Long term (current) use of insulin: Secondary | ICD-10-CM | POA: Insufficient documentation

## 2021-02-06 DIAGNOSIS — J45909 Unspecified asthma, uncomplicated: Secondary | ICD-10-CM | POA: Insufficient documentation

## 2021-02-06 DIAGNOSIS — R1030 Lower abdominal pain, unspecified: Secondary | ICD-10-CM

## 2021-02-06 LAB — CBC
HCT: 42.6 % (ref 39.0–52.0)
Hemoglobin: 14.4 g/dL (ref 13.0–17.0)
MCH: 29.5 pg (ref 26.0–34.0)
MCHC: 33.8 g/dL (ref 30.0–36.0)
MCV: 87.3 fL (ref 80.0–100.0)
Platelets: 354 10*3/uL (ref 150–400)
RBC: 4.88 MIL/uL (ref 4.22–5.81)
RDW: 13.4 % (ref 11.5–15.5)
WBC: 11.3 10*3/uL — ABNORMAL HIGH (ref 4.0–10.5)
nRBC: 0 % (ref 0.0–0.2)

## 2021-02-06 LAB — URINALYSIS, ROUTINE W REFLEX MICROSCOPIC
Bilirubin Urine: NEGATIVE
Glucose, UA: 50 mg/dL — AB
Ketones, ur: 80 mg/dL — AB
Leukocytes,Ua: NEGATIVE
Nitrite: NEGATIVE
Protein, ur: 30 mg/dL — AB
RBC / HPF: >50 RBC/hpf — ABNORMAL HIGH (ref 0–5)
Specific Gravity, Urine: 1.032 — ABNORMAL HIGH (ref 1.005–1.030)
pH: 5 (ref 5.0–8.0)

## 2021-02-06 LAB — COMPREHENSIVE METABOLIC PANEL
ALT: 36 U/L (ref 0–44)
AST: 25 U/L (ref 15–41)
Albumin: 4.3 g/dL (ref 3.5–5.0)
Alkaline Phosphatase: 83 U/L (ref 38–126)
Anion gap: 14 (ref 5–15)
BUN: 23 mg/dL (ref 8–23)
CO2: 20 mmol/L — ABNORMAL LOW (ref 22–32)
Calcium: 9.7 mg/dL (ref 8.9–10.3)
Chloride: 103 mmol/L (ref 98–111)
Creatinine, Ser: 1.12 mg/dL (ref 0.61–1.24)
GFR, Estimated: >60 mL/min (ref >60)
Glucose, Bld: 185 mg/dL — ABNORMAL HIGH (ref 70–99)
Potassium: 3.8 mmol/L (ref 3.5–5.1)
Sodium: 137 mmol/L (ref 135–145)
Total Bilirubin: 1.1 mg/dL (ref 0.3–1.2)
Total Protein: 8.4 g/dL — ABNORMAL HIGH (ref 6.5–8.1)

## 2021-02-06 LAB — I-STAT VENOUS BLOOD GAS, ED
Acid-Base Excess: 2 mmol/L (ref 0.0–2.0)
Bicarbonate: 22.1 mmol/L (ref 20.0–28.0)
Calcium, Ion: 1.11 mmol/L — ABNORMAL LOW (ref 1.15–1.40)
HCT: 43 % (ref 39.0–52.0)
Hemoglobin: 14.6 g/dL (ref 13.0–17.0)
O2 Saturation: 99 %
Potassium: 4 mmol/L (ref 3.5–5.1)
Sodium: 140 mmol/L (ref 135–145)
TCO2: 23 mmol/L (ref 22–32)
pCO2, Ven: 23.7 mmHg — ABNORMAL LOW (ref 44.0–60.0)
pH, Ven: 7.578 — ABNORMAL HIGH (ref 7.250–7.430)
pO2, Ven: 128 mmHg — ABNORMAL HIGH (ref 32.0–45.0)

## 2021-02-06 LAB — SARS CORONAVIRUS 2 (TAT 6-24 HRS): SARS Coronavirus 2: NEGATIVE

## 2021-02-06 LAB — LIPASE, BLOOD: Lipase: 35 U/L (ref 11–51)

## 2021-02-06 MED ORDER — SODIUM CHLORIDE 0.9 % IV BOLUS
1000.0000 mL | Freq: Once | INTRAVENOUS | Status: AC
  Administered 2021-02-06: 1000 mL via INTRAVENOUS

## 2021-02-06 MED ORDER — MORPHINE SULFATE (PF) 4 MG/ML IV SOLN
4.0000 mg | Freq: Once | INTRAVENOUS | Status: AC
  Administered 2021-02-06: 4 mg via INTRAVENOUS
  Filled 2021-02-06: qty 1

## 2021-02-06 MED ORDER — IOHEXOL 300 MG/ML  SOLN
100.0000 mL | Freq: Once | INTRAMUSCULAR | Status: AC | PRN
  Administered 2021-02-06: 100 mL via INTRAVENOUS

## 2021-02-06 MED ORDER — ONDANSETRON 4 MG PO TBDP: 4.0000 mg | ORAL_TABLET | Freq: Three times a day (TID) | ORAL | 0 refills | Status: DC | PRN

## 2021-02-06 MED ORDER — ALBUTEROL SULFATE HFA 108 (90 BASE) MCG/ACT IN AERS
1.0000 | INHALATION_SPRAY | Freq: Once | RESPIRATORY_TRACT | Status: AC
  Administered 2021-02-06: 2 via RESPIRATORY_TRACT
  Filled 2021-02-06: qty 6.7

## 2021-02-06 MED ORDER — ONDANSETRON HCL 4 MG/2ML IJ SOLN
4.0000 mg | Freq: Once | INTRAMUSCULAR | Status: AC
  Administered 2021-02-06: 4 mg via INTRAVENOUS
  Filled 2021-02-06: qty 2

## 2021-02-06 NOTE — ED Triage Notes (Signed)
Patient complains of persistent nausea and emesis for the last three days. Also reports he is a diabetic and has not taken his medication this week due to emesis. Patient alert, oriented, and in no apparent distress at this time.

## 2021-02-06 NOTE — Discharge Instructions (Addendum)
Take zofran for nausea.   As we discussed, your work-up and CT and pelvis were reassuring.  You have a Covid test pending.  Follow-up with your primary care doctor.  Return to the Emergency Department immediately if you experience any worsening abdominal pain, fever, persistent nausea and vomiting, inability keep any food down, pain with urination, blood in your urine or any other worsening or concerning symptoms.

## 2021-02-06 NOTE — ED Notes (Signed)
Got patient into a gown on the monitor patient is resting with call bell in reach 

## 2021-02-06 NOTE — ED Provider Notes (Signed)
[facility]ENC[facility] Provider Note   CSN: [id number] Arrival date & time: 02/06/21  1052     History Chief Complaint  Patient presents with  . Emesis    Antonio Baldwin is a 65 y.o. male past medical history of allergy, arthritis, asthma, diabetes who presents for evaluation of nausea/vomiting x3 days.  He reports he has not been able to eat or drink anything.  Emesis is nonbloody, nonbilious.  He states occasionally, he will have some abdominal pain but states it will ease off.  When he has it, sometimes in the left lower quadrant.  He has not had any diarrhea.  He states he has had some subjective fever and chills but has not measured any with a thermometer.  He states that while being here in the ED, his asthma started flaring up and he felt like he was having an asthma attack.  He does not have his inhaler with him.  He denies any urinary complaints, chest pain.  He states he has not taken his diabetes medication for last several days.  He does endorse smoking marijuana and states he last smoked a few weeks ago. He does not drink.   The history is provided by the patient.       Past Medical History:  Diagnosis Date  . Allergy   . Arthritis   . Asthma   . Diabetes mellitus without complication [facility](HCC)     Patient Active Problem List   Diagnosis Date Noted  . Primary osteoarthritis of right hip 05/23/2020  . Essential hypertension, benign 04/15/2013  . Hyperlipidemia LDL goal < 100 04/15/2013  . Type 1 diabetes mellitus on insulin therapy (HCC) 09/15/2012  . ED (erectile dysfunction) 09/15/2012    Past Surgical History:  Procedure Laterality Date  . TONSILLECTOMY AND ADENOIDECTOMY  1960       Family History  Problem Relation Age of Onset  . Arthritis Other   . Hypertension Other   . Diabetes Other   . Arthritis Mother   . Hypertension Mother   . Diabetes Mother   . Heart disease Neg Hx   . Early death Neg Hx   . Stroke Neg Hx      Social History   Tobacco Use  . Smoking status: Former Smoker    Quit date: 09/15/2002    Years since quitting: 18.4  . Smokeless tobacco: Never Used  Substance Use Topics  . Alcohol use: No  . Drug use: No    Home Medications Prior to Admission medications   Medication Sig Start Date End Date Taking? Authorizing Provider  ondansetron (ZOFRAN ODT) 4 MG disintegrating tablet Take 1 tablet (4 mg total) by mouth every 8 (eight) hours as needed for nausea or vomiting. 02/06/21  Yes Graciella FreerLayden, Lindsey A, PA-C  acetaminophen-codeine (TYLENOL #3) 300-30 MG per tablet Take 1-2 tablets by mouth every 4 (four) hours as needed. 02/23/14   Carmelina DaneAnderson, Jeffery S, MD  Azilsartan Medoxomil (EDARBI) 40 MG TABS Take 1 tablet by mouth daily. 04/15/13   Etta GrandchildJones, Thomas L, MD  cephALEXin (KEFLEX) 500 MG capsule Take 1 capsule (500 mg total) by mouth 3 (three) times daily. 02/25/14   Collene Gobbleaub, Steven A, MD  ciprofloxacin (CIPRO) 500 MG tablet Take 1 tablet (500 mg total) by mouth 2 (two) times daily. 05/18/20   Bethann BerkshireZammit, Joseph, MD  insulin aspart (NOVOLOG FLEXPEN) 100 unit/mL SOLN FlexPen Inject 5 Units into the skin 3 (three) times daily with meals. 04/15/13  Etta Grandchild, MD  insulin glargine (LANTUS) 100 UNIT/ML injection Inject 0.5 mLs (50 Units total) into the skin daily. 04/15/13   Etta Grandchild, MD  rosuvastatin (CRESTOR) 10 MG tablet Take 1 tablet (10 mg total) by mouth daily. 04/15/13   Etta Grandchild, MD  silver sulfADIAZINE (SILVADENE) 1 % cream Apply 1 application topically daily. 02/26/14   Carmelina Dane, MD  traMADol (ULTRAM) 50 MG tablet Take 1 tablet (50 mg total) by mouth every 6 (six) hours as needed for pain. 01/17/13   Pisciotta, Joni Reining, PA-C    Allergies    Patient has no known allergies.  Review of Systems   Review of Systems  Constitutional: Negative for fever.  Respiratory: Negative for cough and shortness of breath.   Cardiovascular: Negative for chest pain.  Gastrointestinal:  Positive for abdominal pain, nausea and vomiting. Negative for diarrhea.  Genitourinary: Negative for dysuria and hematuria.  Neurological: Negative for headaches.  All other systems reviewed and are negative.   Physical Exam Updated Vital Signs BP 128/78 (BP Location: Left Arm)   Pulse (!) 104   Temp 98.2 F (36.8 C) (Oral)   Resp 15   SpO2 98%   Physical Exam Vitals and nursing note reviewed.  Constitutional:      Appearance: Normal appearance. He is well-developed.  HENT:     Head: Normocephalic and atraumatic.  Eyes:     General: Lids are normal.     Conjunctiva/sclera: Conjunctivae normal.     Pupils: Pupils are equal, round, and reactive to light.  Cardiovascular:     Rate and Rhythm: Normal rate and regular rhythm.     Pulses: Normal pulses.     Heart sounds: Normal heart sounds. No murmur heard. No friction rub. No gallop.   Pulmonary:     Effort: Pulmonary effort is normal.     Breath sounds: Normal breath sounds.     Comments: Lungs clear to auscultation bilaterally.  Symmetric chest rise.  No wheezing, rales, rhonchi. Abdominal:     Palpations: Abdomen is soft. Abdomen is not rigid.     Tenderness: There is abdominal tenderness in the right lower quadrant, suprapubic area and left lower quadrant. There is no guarding.     Comments: Diffuse lower abdominal tenderness.  No rigidity, guarding.  Musculoskeletal:        General: Normal range of motion.     Cervical back: Full passive range of motion without pain.  Skin:    General: Skin is warm and dry.     Capillary Refill: Capillary refill takes less than 2 seconds.  Neurological:     Mental Status: He is alert and oriented to person, place, and time.  Psychiatric:        Speech: Speech normal.     ED Results / Procedures / Treatments   Labs (all labs ordered are listed, but only abnormal results are displayed) Labs Reviewed  COMPREHENSIVE METABOLIC PANEL - Abnormal; Notable for the following components:       Result Value   CO2 20 (*)    Glucose, Bld 185 (*)    Total Protein 8.4 (*)    All other components within normal limits  CBC - Abnormal; Notable for the following components:   WBC 11.3 (*)    All other components within normal limits  URINALYSIS, ROUTINE W REFLEX MICROSCOPIC - Abnormal; Notable for the following components:   Color, Urine AMBER (*)    APPearance HAZY (*)  Specific Gravity, Urine 1.032 (*)    Glucose, UA 50 (*)    Hgb urine dipstick MODERATE (*)    Ketones, ur 80 (*)    Protein, ur 30 (*)    RBC / HPF >50 (*)    Bacteria, UA RARE (*)    All other components within normal limits  I-STAT VENOUS BLOOD GAS, ED - Abnormal; Notable for the following components:   pH, Ven 7.578 (*)    pCO2, Ven 23.7 (*)    pO2, Ven 128.0 (*)    Calcium, Ion 1.11 (*)    All other components within normal limits  SARS CORONAVIRUS 2 (TAT 6-24 HRS)  LIPASE, BLOOD    EKG None  Radiology CT ABDOMEN PELVIS W CONTRAST  Result Date: 02/06/2021 CLINICAL DATA:  Acute generalized abdominal pain. EXAM: CT ABDOMEN AND PELVIS WITH CONTRAST TECHNIQUE: Multidetector CT imaging of the abdomen and pelvis was performed using the standard protocol following bolus administration of intravenous contrast. CONTRAST:  OMNIPAQUE IOHEXOL 300 MG/ML  SOLN COMPARISON:  May 18, 2020. FINDINGS: Lower chest: No acute abnormality. Hepatobiliary: No focal liver abnormality is seen. No gallstones, gallbladder wall thickening, or biliary dilatation. Pancreas: Unremarkable. No pancreatic ductal dilatation or surrounding inflammatory changes. Spleen: Normal in size without focal abnormality. Adrenals/Urinary Tract: Adrenal glands are unremarkable. Kidneys are normal, without renal calculi, focal lesion, or hydronephrosis. Bladder is unremarkable. Stomach/Bowel: Stomach is within normal limits. Appendix appears normal. No evidence of bowel wall thickening, distention, or inflammatory changes. Vascular/Lymphatic:  Aortic atherosclerosis. No enlarged abdominal or pelvic lymph nodes. Reproductive: Prostate is unremarkable. Other: No abdominal wall hernia or abnormality. No abdominopelvic ascites. Musculoskeletal: No acute or significant osseous findings. IMPRESSION: Aortic atherosclerosis. No acute abnormality seen in the abdomen or pelvis. Aortic Atherosclerosis (ICD10-I70.0). Electronically Signed   By: Lupita Raider M.D.   On: 02/06/2021 15:25    Procedures Procedures   Medications Ordered in ED Medications  ondansetron (ZOFRAN) injection 4 mg (4 mg Intravenous Given 02/06/21 1343)  sodium chloride 0.9 % bolus 1,000 mL (0 mLs Intravenous Stopped 02/06/21 1601)  albuterol (VENTOLIN HFA) 108 (90 Base) MCG/ACT inhaler 1-2 puff (2 puffs Inhalation Given 02/06/21 1343)  morphine 4 MG/ML injection 4 mg (4 mg Intravenous Given 02/06/21 1344)  iohexol (OMNIPAQUE) 300 MG/ML solution 100 mL (100 mLs Intravenous Contrast Given 02/06/21 1458)    ED Course  I have reviewed the triage vital signs and the nursing notes.  Pertinent labs & imaging results that were available during my care of the patient were reviewed by me and considered in my medical decision making (see chart for details).    MDM Rules/Calculators/A&P                          65 year old male who presents for evaluation of nausea/vomiting x3 days.  He does report some intermittent abdominal pain.  He reports he has not been able to take his diabetes medication.  On initial arrival, he is afebrile, nontoxic-appearing.  Vital signs are stable.  On exam, some mild diffuse tenderness in the lower abdomen.  No rigidity, guarding.  Given his history of diabetes, concern for DKA versus viral process.  Plan to check labs.  Additionally, will give albuterol Hailer as he feels like it is flared up his asthma.  CMP shows bicarb of 20.  BUN and creatinine within normal limits.  Anion gap is 14.  Lipase is 35.  CBG is 135.  UA  does show moderate hemoglobin,  ketones.  Leukocytes are negative.  CBC shows leukocytosis of 11.3. CBG shows pH of 7.578.    CT abdomen shows aortic atherosclerosis.  No acute abnormality seen in the abdomen or pelvis.  Discussed results with patient.  He reports feeling better.  He is drinking ginger ale here in department and states he feels ready to go home.  Repeat abdominal exam is improved.  Unclear etiology of what is causing his nausea/vomiting.  I did discuss with him the possibility this could be caused by marijuana.  At this time, patient does not appear to be in DKA. At this time, patient exhibits no emergent life-threatening condition that require further evaluation in ED. Patient had ample opportunity for questions and discussion. All patient's questions were answered with full understanding. Strict return precautions discussed. Patient expresses understanding and agreement to plan.   Portions of this note were generated with Scientist, clinical (histocompatibility and immunogenetics). Dictation errors may occur despite best attempts at proofreading.   Final Clinical Impression(s) / ED Diagnoses Final diagnoses:  Lower abdominal pain  Nausea and vomiting, intractability of vomiting not specified, unspecified vomiting type    Rx / DC Orders ED Discharge Orders         Ordered    ondansetron (ZOFRAN ODT) 4 MG disintegrating tablet  Every 8 hours PRN        02/06/21 1553           Maxwell Caul, PA-C 02/07/21 1407    Blane Ohara, MD 02/07/21 1526

## 2021-02-26 LAB — COLOGUARD: Cologuard: NEGATIVE

## 2021-04-10 ENCOUNTER — Ambulatory Visit: Payer: No Typology Code available for payment source | Admitting: Internal Medicine

## 2021-06-05 LAB — HM DIABETES EYE EXAM

## 2021-06-08 ENCOUNTER — Encounter (HOSPITAL_COMMUNITY): Payer: Self-pay | Admitting: Emergency Medicine

## 2021-06-08 ENCOUNTER — Emergency Department (HOSPITAL_COMMUNITY)
Admission: EM | Admit: 2021-06-08 | Discharge: 2021-06-08 | Disposition: A | Discharge status: Home / Self Care | Payer: No Typology Code available for payment source | Attending: Emergency Medicine | Admitting: Emergency Medicine

## 2021-06-08 ENCOUNTER — Other Ambulatory Visit: Payer: Self-pay

## 2021-06-08 ENCOUNTER — Emergency Department (HOSPITAL_COMMUNITY): Payer: No Typology Code available for payment source

## 2021-06-08 DIAGNOSIS — Z794 Long term (current) use of insulin: Secondary | ICD-10-CM | POA: Insufficient documentation

## 2021-06-08 DIAGNOSIS — Z79899 Other long term (current) drug therapy: Secondary | ICD-10-CM | POA: Insufficient documentation

## 2021-06-08 DIAGNOSIS — R112 Nausea with vomiting, unspecified: Secondary | ICD-10-CM | POA: Diagnosis present

## 2021-06-08 DIAGNOSIS — R1012 Left upper quadrant pain: Secondary | ICD-10-CM | POA: Insufficient documentation

## 2021-06-08 DIAGNOSIS — J45909 Unspecified asthma, uncomplicated: Secondary | ICD-10-CM | POA: Insufficient documentation

## 2021-06-08 DIAGNOSIS — R1032 Left lower quadrant pain: Secondary | ICD-10-CM | POA: Insufficient documentation

## 2021-06-08 DIAGNOSIS — E109 Type 1 diabetes mellitus without complications: Secondary | ICD-10-CM | POA: Diagnosis not present

## 2021-06-08 DIAGNOSIS — I10 Essential (primary) hypertension: Secondary | ICD-10-CM | POA: Insufficient documentation

## 2021-06-08 DIAGNOSIS — Z87891 Personal history of nicotine dependence: Secondary | ICD-10-CM | POA: Diagnosis not present

## 2021-06-08 DIAGNOSIS — Z20822 Contact with and (suspected) exposure to covid-19: Secondary | ICD-10-CM | POA: Diagnosis not present

## 2021-06-08 DIAGNOSIS — R1084 Generalized abdominal pain: Secondary | ICD-10-CM | POA: Diagnosis not present

## 2021-06-08 LAB — URINALYSIS, ROUTINE W REFLEX MICROSCOPIC
Bacteria, UA: NONE SEEN
Bilirubin Urine: NEGATIVE
Glucose, UA: 50 mg/dL — AB
Hgb urine dipstick: NEGATIVE
Ketones, ur: 80 mg/dL — AB
Leukocytes,Ua: NEGATIVE
Nitrite: NEGATIVE
Protein, ur: 30 mg/dL — AB
Specific Gravity, Urine: 1.031 — ABNORMAL HIGH (ref 1.005–1.030)
pH: 5 (ref 5.0–8.0)

## 2021-06-08 LAB — CBC
HCT: 43.4 % (ref 39.0–52.0)
Hemoglobin: 14.8 g/dL (ref 13.0–17.0)
MCH: 29.3 pg (ref 26.0–34.0)
MCHC: 34.1 g/dL (ref 30.0–36.0)
MCV: 85.9 fL (ref 80.0–100.0)
Platelets: 336 10*3/uL (ref 150–400)
RBC: 5.05 MIL/uL (ref 4.22–5.81)
RDW: 12.3 % (ref 11.5–15.5)
WBC: 9 10*3/uL (ref 4.0–10.5)
nRBC: 0 % (ref 0.0–0.2)

## 2021-06-08 LAB — COMPREHENSIVE METABOLIC PANEL
ALT: 33 U/L (ref 0–44)
AST: 19 U/L (ref 15–41)
Albumin: 4.4 g/dL (ref 3.5–5.0)
Alkaline Phosphatase: 79 U/L (ref 38–126)
Anion gap: 17 — ABNORMAL HIGH (ref 5–15)
BUN: 19 mg/dL (ref 8–23)
CO2: 19 mmol/L — ABNORMAL LOW (ref 22–32)
Calcium: 9.8 mg/dL (ref 8.9–10.3)
Chloride: 100 mmol/L (ref 98–111)
Creatinine, Ser: 1.12 mg/dL (ref 0.61–1.24)
GFR, Estimated: >60 mL/min (ref >60)
Glucose, Bld: 210 mg/dL — ABNORMAL HIGH (ref 70–99)
Potassium: 4 mmol/L (ref 3.5–5.1)
Sodium: 136 mmol/L (ref 135–145)
Total Bilirubin: 1.2 mg/dL (ref 0.3–1.2)
Total Protein: 8.6 g/dL — ABNORMAL HIGH (ref 6.5–8.1)

## 2021-06-08 LAB — I-STAT VENOUS BLOOD GAS, ED
Acid-Base Excess: 1 mmol/L (ref 0.0–2.0)
Bicarbonate: 23 mmol/L (ref 20.0–28.0)
Calcium, Ion: 1.14 mmol/L — ABNORMAL LOW (ref 1.15–1.40)
HCT: 46 % (ref 39.0–52.0)
Hemoglobin: 15.6 g/dL (ref 13.0–17.0)
O2 Saturation: 88 %
Potassium: 3.8 mmol/L (ref 3.5–5.1)
Sodium: 139 mmol/L (ref 135–145)
TCO2: 24 mmol/L (ref 22–32)
pCO2, Ven: 30 mmHg — ABNORMAL LOW (ref 44.0–60.0)
pH, Ven: 7.492 — ABNORMAL HIGH (ref 7.250–7.430)
pO2, Ven: 49 mmHg — ABNORMAL HIGH (ref 32.0–45.0)

## 2021-06-08 LAB — RESP PANEL BY RT-PCR (FLU A&B, COVID) ARPGX2
Influenza A by PCR: NEGATIVE
Influenza B by PCR: NEGATIVE
SARS Coronavirus 2 by RT PCR: NEGATIVE

## 2021-06-08 LAB — LIPASE, BLOOD: Lipase: 35 U/L (ref 11–51)

## 2021-06-08 MED ORDER — ONDANSETRON 4 MG PO TBDP: 4.0000 mg | ORAL_TABLET | Freq: Three times a day (TID) | ORAL | 0 refills | Status: DC | PRN

## 2021-06-08 MED ORDER — ONDANSETRON HCL 4 MG/2ML IJ SOLN
4.0000 mg | Freq: Once | INTRAMUSCULAR | Status: AC
  Administered 2021-06-08: 4 mg via INTRAVENOUS
  Filled 2021-06-08: qty 2

## 2021-06-08 MED ORDER — SODIUM CHLORIDE 0.9 % IV BOLUS
1000.0000 mL | Freq: Once | INTRAVENOUS | Status: AC
Start: 2021-06-08 — End: 2021-06-08
  Administered 2021-06-08: 1000 mL via INTRAVENOUS

## 2021-06-08 MED ORDER — IOHEXOL 300 MG/ML  SOLN
100.0000 mL | Freq: Once | INTRAMUSCULAR | Status: AC | PRN
  Administered 2021-06-08: 100 mL via INTRAVENOUS

## 2021-06-08 MED ORDER — MORPHINE SULFATE (PF) 4 MG/ML IV SOLN
4.0000 mg | Freq: Once | INTRAVENOUS | Status: AC
  Administered 2021-06-08: 4 mg via INTRAVENOUS
  Filled 2021-06-08: qty 1

## 2021-06-08 NOTE — ED Notes (Signed)
Pt verbalizes understanding of discharge instructions. Opportunity for questions and answers were provided. Pt discharged from the ED.   ?

## 2021-06-08 NOTE — Discharge Instructions (Addendum)
Take the medication as prescribed  Keep close eye on your blood sugars  Return for new or worsening symptoms

## 2021-06-08 NOTE — ED Triage Notes (Signed)
Patient coming from home. Complaint of nausea and vomiting for approx 1 week. Patient states he has not been able to keep any food down for a week.

## 2021-06-08 NOTE — ED Provider Notes (Signed)
[facility] EMERGENCY DEPARTMENT Provider Note   CSN: [id number] Arrival date & time: 06/08/21  7026     History Chief Complaint  Patient presents with   Nausea   Emesis    Antonio Baldwin is a 65 y.o. male with past medical history significant for type 1 diabetes, hypertension who presents for evaluation of abdominal pain and emesis.  Patient with multiple episodes of NBNB emesis over the last 5 days.  Has been unable to keep down liquids.  States he has associated generalized abdominal pain however worse to left lower quadrant, left upper quadrant.  No difficulty urinating.  Last bowel movement this morning which was "normal."  No melena or bright blood per rectum.  No known sick contacts.  No prior abdominal surgeries.  He is passing flatus.  Last blood sugar this morning was 121.  He is not on an insulin pump.  He denies any headache, lightheadedness, dizziness, chest pain, shortness of breath, back pain, dysuria, hematuria.  No prior history of kidney stones.  Overall decreased appetite. Rates his current pain a 5/10.  Denies additional aggravating or alleviating factors.  History obtained from patient and past medical records.  No interpreter used.  HPI     Past Medical History:  Diagnosis Date   Allergy    Arthritis    Asthma    Diabetes mellitus without complication [facility])     Patient Active Problem List   Diagnosis Date Noted   Primary osteoarthritis of right hip 05/23/2020   Essential hypertension, benign 04/15/2013   Hyperlipidemia LDL goal < 100 04/15/2013   Type 1 diabetes mellitus on insulin therapy (HCC) 09/15/2012   ED (erectile dysfunction) 09/15/2012    Past Surgical History:  Procedure Laterality Date   TONSILLECTOMY AND ADENOIDECTOMY  1960       Family History  Problem Relation Age of Onset   Arthritis Other    Hypertension Other    Diabetes Other    Arthritis Mother    Hypertension Mother    Diabetes Mother    Heart disease  Neg Hx    Early death Neg Hx    Stroke Neg Hx     Social History   Tobacco Use   Smoking status: Former    Types: Cigarettes    Quit date: 09/15/2002    Years since quitting: 18.7   Smokeless tobacco: Never  Substance Use Topics   Alcohol use: No   Drug use: No    Home Medications Prior to Admission medications   Medication Sig Start Date End Date Taking? Authorizing Provider  acetaminophen-codeine (TYLENOL #3) 300-30 MG per tablet Take 1-2 tablets by mouth every 4 (four) hours as needed. 02/23/14   Carmelina Dane, MD  Azilsartan Medoxomil (EDARBI) 40 MG TABS Take 1 tablet by mouth daily. 04/15/13   Etta Grandchild, MD  cephALEXin (KEFLEX) 500 MG capsule Take 1 capsule (500 mg total) by mouth 3 (three) times daily. 02/25/14   Collene Gobble, MD  ciprofloxacin (CIPRO) 500 MG tablet Take 1 tablet (500 mg total) by mouth 2 (two) times daily. 05/18/20   Bethann Berkshire, MD  insulin aspart (NOVOLOG FLEXPEN) 100 unit/mL SOLN FlexPen Inject 5 Units into the skin 3 (three) times daily with meals. 04/15/13   Etta Grandchild, MD  insulin glargine (LANTUS) 100 UNIT/ML injection Inject 0.5 mLs (50 Units total) into the skin daily. 04/15/13   Etta Grandchild, MD  ondansetron (ZOFRAN ODT) 4 MG disintegrating tablet  Take 1 tablet (4 mg total) by mouth every 8 (eight) hours as needed for nausea or vomiting. 06/08/21   Marybeth Dandy A, PA-C  rosuvastatin (CRESTOR) 10 MG tablet Take 1 tablet (10 mg total) by mouth daily. 04/15/13   Etta GrandchildJones, Thomas L, MD  silver sulfADIAZINE (SILVADENE) 1 % cream Apply 1 application topically daily. 02/26/14   Carmelina DaneAnderson, Jeffery S, MD  traMADol (ULTRAM) 50 MG tablet Take 1 tablet (50 mg total) by mouth every 6 (six) hours as needed for pain. 01/17/13   Pisciotta, Joni ReiningNicole, PA-C    Allergies    Patient has no known allergies.  Review of Systems   Review of Systems  Constitutional: Negative.   HENT: Negative.    Respiratory: Negative.    Cardiovascular: Negative.    Gastrointestinal:  Positive for abdominal pain, nausea and vomiting. Negative for abdominal distention, anal bleeding, blood in stool, constipation, diarrhea and rectal pain.  Genitourinary: Negative.   Musculoskeletal: Negative.   Skin: Negative.   Neurological: Negative.   All other systems reviewed and are negative.  Physical Exam Updated Vital Signs BP (!) 139/93   Pulse 95   Temp 98.4 F (36.9 C)   Resp 17   Ht 5\' 8"  (1.727 m)   Wt 87.5 kg   SpO2 100%   BMI 29.35 kg/m   Physical Exam Vitals and nursing note reviewed.  Constitutional:      General: He is not in acute distress.    Appearance: He is well-developed. He is not ill-appearing, toxic-appearing or diaphoretic.  HENT:     Head: Normocephalic and atraumatic.     Mouth/Throat:     Mouth: Mucous membranes are dry.  Eyes:     Pupils: Pupils are equal, round, and reactive to light.  Cardiovascular:     Rate and Rhythm: Normal rate and regular rhythm.     Pulses: Normal pulses.     Heart sounds: Normal heart sounds.  Pulmonary:     Effort: Pulmonary effort is normal. No respiratory distress.     Breath sounds: Normal breath sounds and air entry.     Comments: Clear bilaterally, speaks in full sentences without difficulty Chest:     Comments: Equal rise and fall to chest wall Abdominal:     General: Bowel sounds are normal. There is no distension.     Palpations: Abdomen is soft.     Tenderness: There is generalized abdominal tenderness and tenderness in the left upper quadrant and left lower quadrant. There is no right CVA tenderness, left CVA tenderness, guarding or rebound.     Comments: Generalized tenderness however worse to left lower quadrant, left upper quadrant.  No obvious hernias, overlying skin changes to abdominal wall.  Musculoskeletal:        General: No swelling or tenderness. Normal range of motion.     Cervical back: Normal range of motion and neck supple.     Right lower leg: No edema.      Left lower leg: No edema.  Skin:    General: Skin is warm and dry.     Capillary Refill: Capillary refill takes less than 2 seconds.     Comments: No rashes or lesions  Neurological:     General: No focal deficit present.     Mental Status: He is alert and oriented to person, place, and time.     Comments: Cranial nerves 2-12 grossly intact    ED Results / Procedures / Treatments   Labs (all labs  ordered are listed, but only abnormal results are displayed) Labs Reviewed  COMPREHENSIVE METABOLIC PANEL - Abnormal; Notable for the following components:      Result Value   CO2 19 (*)    Glucose, Bld 210 (*)    Total Protein 8.6 (*)    Anion gap 17 (*)    All other components within normal limits  URINALYSIS, ROUTINE W REFLEX MICROSCOPIC - Abnormal; Notable for the following components:   Color, Urine AMBER (*)    APPearance HAZY (*)    Specific Gravity, Urine 1.031 (*)    Glucose, UA 50 (*)    Ketones, ur 80 (*)    Protein, ur 30 (*)    All other components within normal limits  I-STAT VENOUS BLOOD GAS, ED - Abnormal; Notable for the following components:   pH, Ven 7.492 (*)    pCO2, Ven 30.0 (*)    pO2, Ven 49.0 (*)    Calcium, Ion 1.14 (*)    All other components within normal limits  RESP PANEL BY RT-PCR (FLU A&B, COVID) ARPGX2  LIPASE, BLOOD  CBC    EKG None  Radiology CT Abdomen Pelvis W Contrast  Result Date: 06/08/2021 CLINICAL DATA:  Nausea, vomiting, and LEFT LOWER QUADRANT pain. Symptoms for 1 week. EXAM: CT ABDOMEN AND PELVIS WITH CONTRAST TECHNIQUE: Multidetector CT imaging of the abdomen and pelvis was performed using the standard protocol following bolus administration of intravenous contrast. CONTRAST:  OMNIPAQUE IOHEXOL 300 MG/ML  SOLN COMPARISON:  02/06/2021 FINDINGS: Lower chest: There is minimal subsegmental atelectasis at the lung bases. Heart size is normal. Hepatobiliary: There is focal fatty infiltration adjacent to the falciform ligament. No  suspicious liver lesions. No radiopaque gallstones, biliary dilatation, or pericholecystic inflammatory changes. Pancreas: Unremarkable. No pancreatic ductal dilatation or surrounding inflammatory changes. Spleen: Normal in size without focal abnormality. Adrenals/Urinary Tract: Adrenal glands are unremarkable. Kidneys are normal, without renal calculi, focal lesion, or hydronephrosis. Bladder is unremarkable. Stomach/Bowel: Stomach is normal in appearance. Small bowel loops are unremarkable. The appendix is well seen and has a normal appearance. The colon is unremarkable. Vascular/Lymphatic: There is atherosclerotic calcification of the abdominal aorta, not associated with aneurysm. There is normal vascular opacification of the celiac axis, superior mesenteric artery, and inferior mesenteric artery. Normal appearance of the portal venous system and inferior vena cava. Reproductive: Prostate is unremarkable. Other: No abdominal wall hernia or abnormality. No abdominopelvic ascites. Musculoskeletal: There are degenerative changes in both hips. IMPRESSION: 1. No acute abnormality of the abdomen and pelvis. 2. Normal appendix. 3. No bowel obstruction or acute urinary tract abnormality. 4.  Aortic atherosclerosis.  (ICD10-I70.0) Electronically Signed   By: Norva Pavlov M.D.   On: 06/08/2021 11:40    Procedures Procedures   Medications Ordered in ED Medications  ondansetron (ZOFRAN) injection 4 mg (4 mg Intravenous Given 06/08/21 0939)  sodium chloride 0.9 % bolus 1,000 mL (0 mLs Intravenous Stopped 06/08/21 1114)  morphine 4 MG/ML injection 4 mg (4 mg Intravenous Given 06/08/21 0941)  sodium chloride 0.9 % bolus 1,000 mL (0 mLs Intravenous Stopped 06/08/21 1148)  iohexol (OMNIPAQUE) 300 MG/ML solution 100 mL (100 mLs Intravenous Contrast Given 06/08/21 1110)    ED Course  I have reviewed the triage vital signs and the nursing notes.  Pertinent labs & imaging results that were available during my care of  the patient were reviewed by me and considered in my medical decision making (see chart for details).  Here for evaluation of nausea,  vomiting and abdominal pain which began approximately 5 days ago.  He is afebrile, nonseptic, not ill-appearing.  Patient does appear dry.  Heart and lungs clear.  Abdomen diffusely tender however worse left upper quadrant, left lower quadrant.  Denies any melena or blood per rectum.  We will plan on labs, imaging and reassess  Labs and imaging personally reviewed and interpreted:  UA negative for infection however does have ketonuria, proteinuria, glucosuria does appear to be concentrated CBC without leukocytosis CMP glucose 210, gap 17, given generous IV fluids, Lipase 35 COVID flu negative VBG no acidosis, bicarb normal CTAP without acute finding  Patient reassessed.  Has not had anything additional for pain.  He would like to p.o. challenge.  Feels significantly improved.  Unclear ideology of symptoms.  Does have an anion gap of 17 on his metabolic panel however I feel this is likely due to dehydration.  Low suspicion for DKA.  No acidosis, bicarb normal on VBG  Patient reassessed.  Tolerating p.o. intake.  CT scan without acute abnormality.  Will treat Zofran ODT at home.  Discussed return precautions.  Patient and family in room voiced understanding are agreeable for follow-up.  On repeat exam patient does not have a surgical abdomin and there are no peritoneal signs.  No indication of appendicitis, bowel obstruction, bowel perforation, cholecystitis, diverticulitis, AAA, dissection, mesenteric ischemia, atypical intrathoracic etiology.  Patient discharged home with symptomatic treatment and given strict instructions for follow-up with their primary care physician.  I have also discussed reasons to return immediately to the ER.  Patient expresses understanding and agrees with plan.  The patient has been appropriately medically screened and/or stabilized in  the ED. I have low suspicion for any other emergent medical condition which would require further screening, evaluation or treatment in the ED or require inpatient management.  Patient is hemodynamically stable and in no acute distress.  Patient able to ambulate in department prior to ED.  Evaluation does not show acute pathology that would require ongoing or additional emergent interventions while in the emergency department or further inpatient treatment.  I have discussed the diagnosis with the patient and answered all questions.  Pain is been managed while in the emergency department and patient has no further complaints prior to discharge.  Patient is comfortable with plan discussed in room and is stable for discharge at this time.  I have discussed strict return precautions for returning to the emergency department.  Patient was encouraged to follow-up with PCP/specialist refer to at discharge.   Patient discussed with attending who is agreeable with above treatment, plan disposition.      MDM Rules/Calculators/A&P                           Final Clinical Impression(s) / ED Diagnoses Final diagnoses:  Non-intractable vomiting with nausea, unspecified vomiting type  Generalized abdominal pain    Rx / DC Orders ED Discharge Orders          Ordered    ondansetron (ZOFRAN ODT) 4 MG disintegrating tablet  Every 8 hours PRN,   Status:  Discontinued        06/08/21 1353    ondansetron (ZOFRAN ODT) 4 MG disintegrating tablet  Every 8 hours PRN        06/08/21 1406             Havana Baldwin A, PA-C 06/08/21 1408    Gerhard Munch, MD 06/09/21 [phone]

## 2021-06-08 NOTE — ED Notes (Signed)
Pt able to tolerate [place] sandwich, crackers, and water cup without nausea or vomiting.

## 2021-06-20 ENCOUNTER — Other Ambulatory Visit: Payer: Self-pay

## 2021-06-20 ENCOUNTER — Ambulatory Visit (INDEPENDENT_AMBULATORY_CARE_PROVIDER_SITE_OTHER): Payer: No Typology Code available for payment source | Admitting: Internal Medicine

## 2021-06-20 ENCOUNTER — Encounter: Payer: Self-pay | Admitting: Internal Medicine

## 2021-06-20 VITALS — BP 126/66 | HR 88 | Temp 98.4°F | Resp 16 | Ht 68.0 in | Wt 175.0 lb

## 2021-06-20 DIAGNOSIS — Z125 Encounter for screening for malignant neoplasm of prostate: Secondary | ICD-10-CM

## 2021-06-20 DIAGNOSIS — E119 Type 2 diabetes mellitus without complications: Secondary | ICD-10-CM | POA: Insufficient documentation

## 2021-06-20 DIAGNOSIS — E118 Type 2 diabetes mellitus with unspecified complications: Secondary | ICD-10-CM | POA: Diagnosis not present

## 2021-06-20 DIAGNOSIS — I10 Essential (primary) hypertension: Secondary | ICD-10-CM | POA: Diagnosis not present

## 2021-06-20 DIAGNOSIS — E785 Hyperlipidemia, unspecified: Secondary | ICD-10-CM | POA: Diagnosis not present

## 2021-06-20 DIAGNOSIS — R7989 Other specified abnormal findings of blood chemistry: Secondary | ICD-10-CM

## 2021-06-20 DIAGNOSIS — E139 Other specified diabetes mellitus without complications: Secondary | ICD-10-CM | POA: Diagnosis not present

## 2021-06-20 LAB — LIPID PANEL
Cholesterol: 259 mg/dL — ABNORMAL HIGH (ref 0–200)
HDL: 37.8 mg/dL — ABNORMAL LOW (ref >39.00)
LDL Cholesterol: 189 mg/dL — ABNORMAL HIGH (ref 0–99)
NonHDL: 220.73
Total CHOL/HDL Ratio: 7
Triglycerides: 158 mg/dL — ABNORMAL HIGH (ref 0.0–149.0)
VLDL: 31.6 mg/dL (ref 0.0–40.0)

## 2021-06-20 LAB — MICROALBUMIN / CREATININE URINE RATIO
Creatinine,U: 164.9 mg/dL
Microalb Creat Ratio: 0.4 mg/g (ref 0.0–30.0)
Microalb, Ur: <0.7 mg/dL (ref 0.0–1.9)

## 2021-06-20 LAB — URINALYSIS, ROUTINE W REFLEX MICROSCOPIC
Bilirubin Urine: NEGATIVE
Hgb urine dipstick: NEGATIVE
Ketones, ur: NEGATIVE
Leukocytes,Ua: NEGATIVE
Nitrite: NEGATIVE
RBC / HPF: NONE SEEN (ref 0–?)
Specific Gravity, Urine: >=1.03 — AB (ref 1.000–1.030)
Total Protein, Urine: NEGATIVE
Urine Glucose: 250 — AB
Urobilinogen, UA: 0.2 (ref 0.0–1.0)
pH: 5.5 (ref 5.0–8.0)

## 2021-06-20 LAB — BASIC METABOLIC PANEL
BUN: 17 mg/dL (ref 6–23)
CO2: 26 mEq/L (ref 19–32)
Calcium: 10 mg/dL (ref 8.4–10.5)
Chloride: 99 mEq/L (ref 96–112)
Creatinine, Ser: 1 mg/dL (ref 0.40–1.50)
GFR: 78.64 mL/min (ref >60.00)
Glucose, Bld: 151 mg/dL — ABNORMAL HIGH (ref 70–99)
Potassium: 4.2 mEq/L (ref 3.5–5.1)
Sodium: 135 mEq/L (ref 135–145)

## 2021-06-20 LAB — PSA: PSA: 0.45 ng/mL (ref 0.10–4.00)

## 2021-06-20 LAB — TSH: TSH: 0.2 u[IU]/mL — ABNORMAL LOW (ref 0.35–5.50)

## 2021-06-20 LAB — HEMOGLOBIN A1C: Hgb A1c MFr Bld: 7.3 % — ABNORMAL HIGH (ref 4.6–6.5)

## 2021-06-20 NOTE — Patient Instructions (Signed)

## 2021-06-20 NOTE — Progress Notes (Signed)
ldl  Subjective:  Patient ID: Antonio Baldwin, male    DOB: Apr 20, 1956  Age: 65 y.o. MRN: [id number]  CC: Hypertension, Hyperlipidemia, and Diabetes  This visit occurred during the SARS-CoV-2 public health emergency.  Safety protocols were in place, including screening questions prior to the visit, additional usage of staff PPE, and extensive cleaning of exam room while observing appropriate contact time as indicated for disinfecting solutions.    HPI Antonio Baldwin presents for establishing.  He has been receiving his care at the [place].  He has no records or information with him.  He tells me his blood sugar has been well controlled.  He denies polys.  He is active and denies any recent episodes of chest pain, shortness of breath, diaphoresis, dizziness, or lightheadedness.  History Sang has a past medical history of Allergy, Arthritis, Asthma, Diabetes mellitus without complication (HCC), GERD (gastroesophageal reflux disease), and HTN (hypertension).   He has a past surgical history that includes Tonsillectomy and adenoidectomy (11/24/1958) and Wisdom tooth extraction.   His family history includes Arthritis in his mother and another family member; Diabetes in his mother and another family member; Hypertension in his mother and another family member.He reports that he quit smoking about 18 years ago. His smoking use included cigarettes. He has never used smokeless tobacco. He reports that he does not drink alcohol and does not use drugs.  Outpatient Medications Prior to Visit  Medication Sig Dispense Refill   Azilsartan Medoxomil (EDARBI) 40 MG TABS Take 1 tablet by mouth daily. 90 tablet 3   insulin aspart (NOVOLOG FLEXPEN) 100 unit/mL SOLN FlexPen Inject 5 Units into the skin 3 (three) times daily with meals. 3 mL 11   insulin glargine (LANTUS) 100 UNIT/ML injection Inject 0.5 mLs (50 Units total) into the skin daily. 10 mL 11   acetaminophen-codeine (TYLENOL #3) 300-30 MG per tablet  Take 1-2 tablets by mouth every 4 (four) hours as needed. 40 tablet 0   cephALEXin (KEFLEX) 500 MG capsule Take 1 capsule (500 mg total) by mouth 3 (three) times daily. 30 capsule 0   ciprofloxacin (CIPRO) 500 MG tablet Take 1 tablet (500 mg total) by mouth 2 (two) times daily. 14 tablet 0   ondansetron (ZOFRAN ODT) 4 MG disintegrating tablet Take 1 tablet (4 mg total) by mouth every 8 (eight) hours as needed for nausea or vomiting. 20 tablet 0   rosuvastatin (CRESTOR) 10 MG tablet Take 1 tablet (10 mg total) by mouth daily. 126 tablet 3   silver sulfADIAZINE (SILVADENE) 1 % cream Apply 1 application topically daily. 200 g 1   traMADol (ULTRAM) 50 MG tablet Take 1 tablet (50 mg total) by mouth every 6 (six) hours as needed for pain. 15 tablet 0   nalbuphine (NUBAIN) 20 MG/ML injection 20 mg      No facility-administered medications prior to visit.    ROS Review of Systems  Constitutional:  Negative for chills, diaphoresis, fatigue and fever.  HENT: Negative.    Eyes: Negative.   Respiratory:  Negative for cough, chest tightness, shortness of breath and wheezing.   Cardiovascular:  Negative for chest pain, palpitations and leg swelling.  Gastrointestinal:  Negative for abdominal pain, constipation, diarrhea, nausea and vomiting.  Endocrine: Negative.   Genitourinary: Negative.  Negative for difficulty urinating and dysuria.  Musculoskeletal: Negative.  Negative for arthralgias and myalgias.  Skin: Negative.   Neurological: Negative.  Negative for dizziness, weakness, light-headedness and headaches.  Hematological:  Negative for adenopathy. Does  not bruise/bleed easily.  Psychiatric/Behavioral: Negative.     Objective:  BP 126/66 (BP Location: Right Arm, Patient Position: Sitting, Cuff Size: Large)   Pulse 88   Temp 98.4 F (36.9 C) (Oral)   Ht 5\' 8"  (1.727 m)   Wt 175 lb (79.4 kg)   SpO2 97%   BMI 26.61 kg/m   Physical Exam Vitals reviewed.  Constitutional:      Appearance:  Normal appearance.  HENT:     Nose: Nose normal.     Mouth/Throat:     Mouth: Mucous membranes are moist.  Eyes:     Conjunctiva/sclera: Conjunctivae normal.  Cardiovascular:     Rate and Rhythm: Normal rate and regular rhythm.     Heart sounds: No murmur heard. Pulmonary:     Effort: Pulmonary effort is normal.     Breath sounds: No stridor. No wheezing, rhonchi or rales.  Abdominal:     General: Abdomen is flat.     Palpations: There is no mass.     Tenderness: There is no abdominal tenderness. There is no guarding or rebound.     Hernia: No hernia is present.  Genitourinary:    Comments: He was not willing to undress for a GU/rectal exam. Musculoskeletal:        General: Normal range of motion.     Cervical back: Neck supple.     Right lower leg: No edema.     Left lower leg: No edema.  Lymphadenopathy:     Cervical: No cervical adenopathy.  Skin:    General: Skin is warm and dry.  Neurological:     General: No focal deficit present.     Mental Status: He is alert.    Lab Results  Component Value Date   WBC 9.0 06/08/2021   HGB 15.6 06/08/2021   HCT 46.0 06/08/2021   PLT 336 06/08/2021   GLUCOSE 151 (H) 06/20/2021   CHOL 259 (H) 06/20/2021   TRIG 158.0 (H) 06/20/2021   HDL 37.80 (L) 06/20/2021   LDLDIRECT 146.0 09/15/2012   LDLCALC 189 (H) 06/20/2021   ALT 33 06/08/2021   AST 19 06/08/2021   NA 135 06/20/2021   K 4.2 06/20/2021   CL 99 06/20/2021   CREATININE 1.00 06/20/2021   BUN 17 06/20/2021   CO2 26 06/20/2021   TSH 0.20 (L) 06/20/2021   PSA 0.45 06/20/2021   HGBA1C 7.3 (H) 06/20/2021   MICROALBUR <0.7 06/20/2021     Assessment & Plan:   Melchor was seen today for hypertension, hyperlipidemia and diabetes.  Diagnoses and all orders for this visit:  Hyperlipidemia with target LDL less than 100- I recommended that he take a statin for cardiovascular risk reduction. -     Lipid panel; Future -     TSH; Future -     TSH -     Lipid panel -      rosuvastatin (CRESTOR) 20 MG tablet; Take 1 tablet (20 mg total) by mouth daily.  Primary hypertension- His blood pressure is adequately well controlled. -     TSH; Future -     Urinalysis, Routine w reflex microscopic; Future -     Urinalysis, Routine w reflex microscopic -     TSH  Type II diabetes mellitus with manifestations (HCC)  Prostate cancer screening -     PSA; Future -     PSA  Diabetes 1.5, managed as type 2 (HCC)- His blood sugar is adequately well controlled. -  Basic metabolic panel; Future -     Microalbumin / creatinine urine ratio; Future -     Hemoglobin A1c; Future -     Urinalysis, Routine w reflex microscopic; Future -     HM Diabetes Foot Exam -     Urinalysis, Routine w reflex microscopic -     Hemoglobin A1c -     Microalbumin / creatinine urine ratio -     Basic metabolic panel  I have discontinued Caspar L. Colan's traMADol, rosuvastatin, acetaminophen-codeine, cephALEXin, silver sulfADIAZINE, ciprofloxacin, and ondansetron. I am also having him start on rosuvastatin. Additionally, I am having him maintain his Azilsartan Medoxomil, insulin glargine, and insulin aspart. We will stop administering nalbuphine.  Meds ordered this encounter  Medications   rosuvastatin (CRESTOR) 20 MG tablet    Sig: Take 1 tablet (20 mg total) by mouth daily.    Dispense:  90 tablet    Refill:  1      Follow-up: Return in about 6 months (around 12/21/2021).  Sanda Linger, MD

## 2021-06-21 MED ORDER — ROSUVASTATIN CALCIUM 20 MG PO TABS: 20.0000 mg | ORAL_TABLET | Freq: Every day | ORAL | 1 refills | Status: DC

## 2021-06-23 ENCOUNTER — Encounter: Payer: Self-pay | Admitting: Internal Medicine

## 2021-06-23 DIAGNOSIS — E139 Other specified diabetes mellitus without complications: Secondary | ICD-10-CM | POA: Insufficient documentation

## 2021-06-23 DIAGNOSIS — R7989 Other specified abnormal findings of blood chemistry: Secondary | ICD-10-CM | POA: Insufficient documentation

## 2021-08-05 ENCOUNTER — Telehealth: Payer: Self-pay | Admitting: General Practice

## 2021-08-05 ENCOUNTER — Other Ambulatory Visit: Payer: Self-pay

## 2021-08-05 ENCOUNTER — Telehealth: Payer: No Typology Code available for payment source | Admitting: Family

## 2021-08-05 NOTE — Telephone Encounter (Signed)
[facility] FYI  Caller states her and husband tested pos for covid today,bad headache, cough, fatigue. no fever.   Advised to call PCP within 24 hrs. On-Call provider contacted them and prescribed a 5 day order of molupiravir 800mg  sent to [facility].

## 2021-11-09 IMAGING — CT CT ABD-PELV W/ CM
2 of 5 series · 16 of 46 positions shown, 18 images · IV contrast (APPLIED)
Comparison: 02/06/2021

CLINICAL DATA: Nausea, vomiting, and LEFT LOWER QUADRANT pain.
Symptoms for 1 week.

EXAM:
CT ABDOMEN AND PELVIS WITH CONTRAST
TECHNIQUE: Multidetector CT imaging of the abdomen and pelvis was performed
using the standard protocol following bolus administration of
intravenous contrast.
CONTRAST:  100mL OMNIPAQUE IOHEXOL 300 MG/ML  SOLN

[Series 3: abdomen 5.0 · axial · 0.83mm/px · z∈[+851,+1241]mm · 13 of 92 slices shown, 15 images]
[im 7/92  soft-tissue]
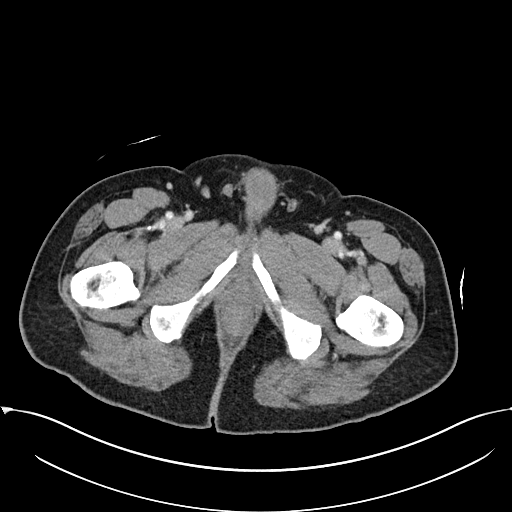
[im 7/92  bone]
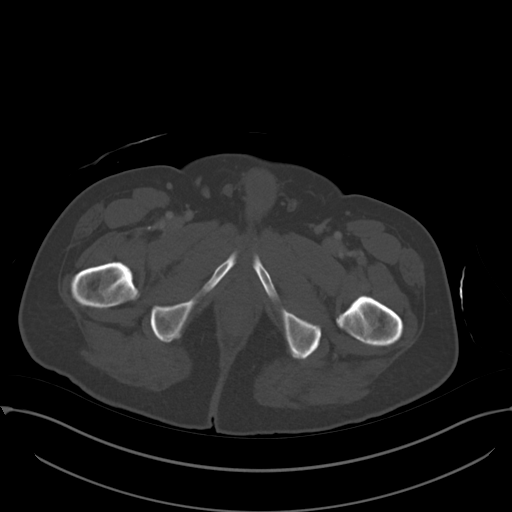
[im 13/92  soft-tissue]
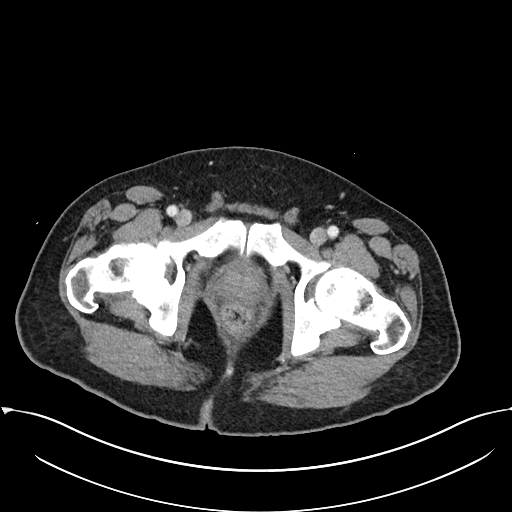
[im 19/92  soft-tissue]
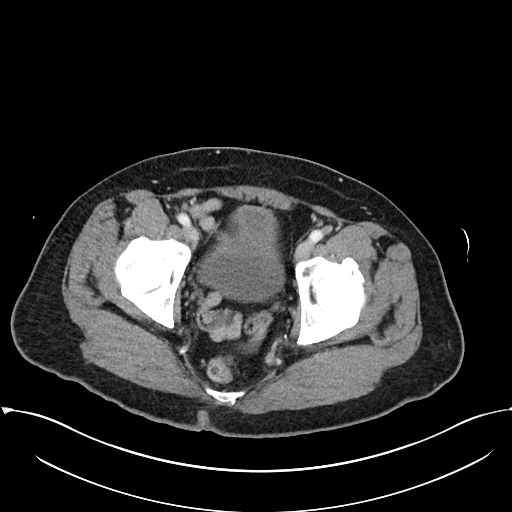
[im 25/92  soft-tissue]
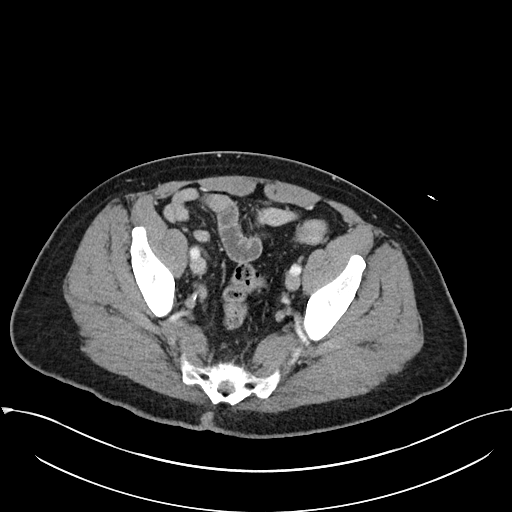
[im 31/92  soft-tissue]
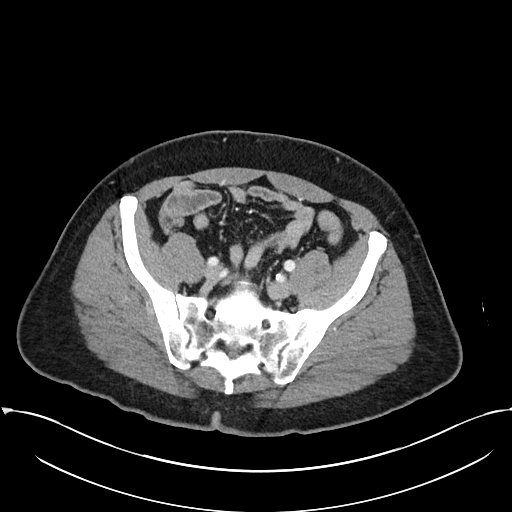
[im 37/92  soft-tissue]
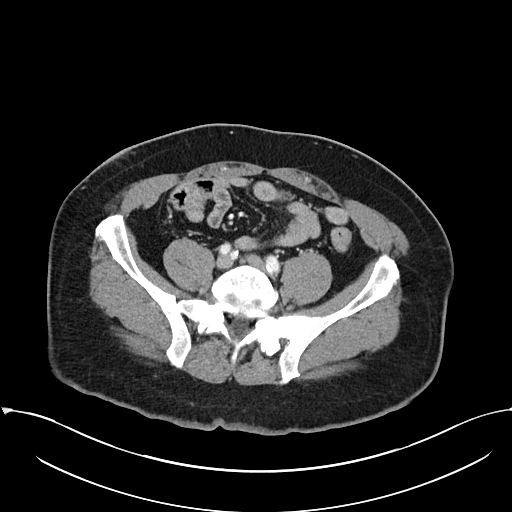
[im 49/92  soft-tissue]
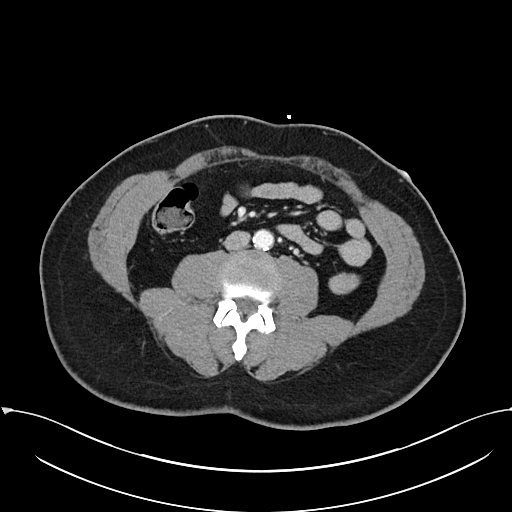
[im 55/92  soft-tissue]
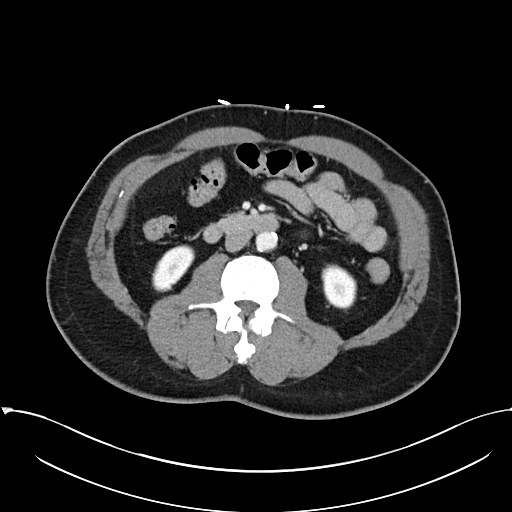
[im 61/92  soft-tissue]
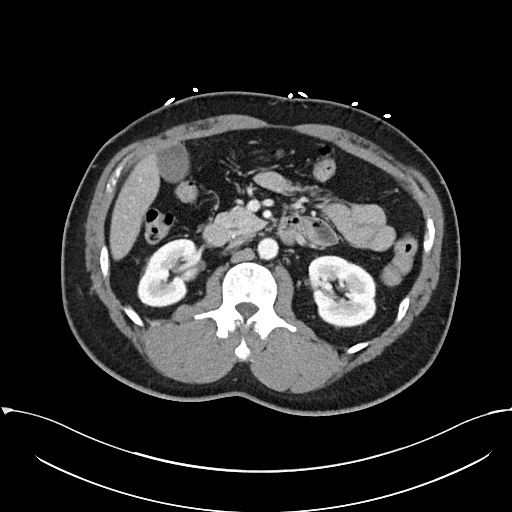
[im 61/92  bone]
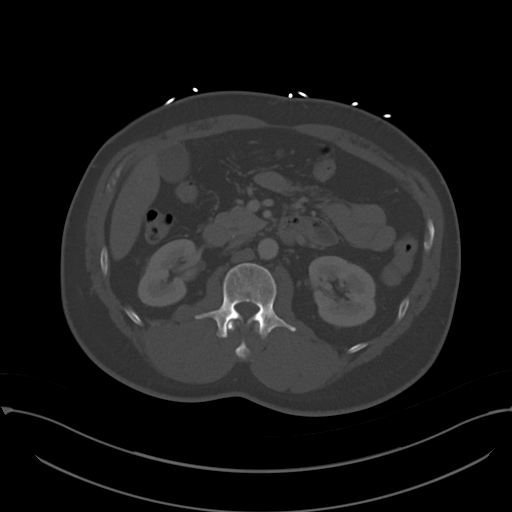
[im 67/92  soft-tissue]
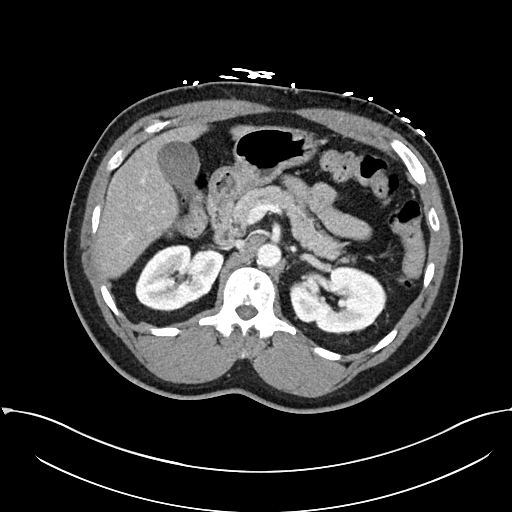
[im 73/92  soft-tissue]
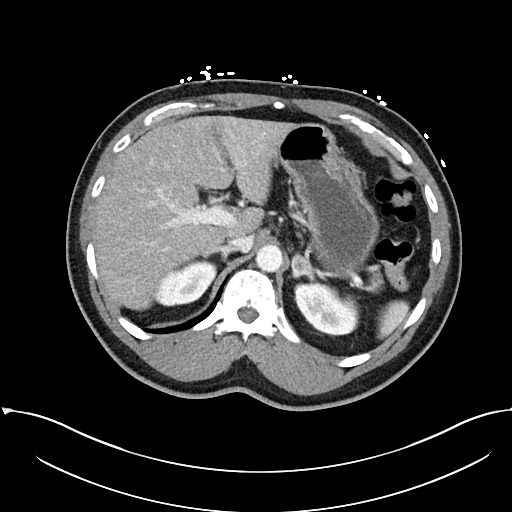
[im 79/92  soft-tissue]
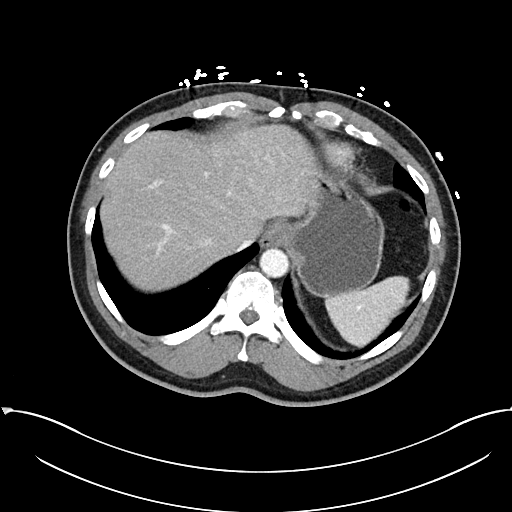
[im 85/92  soft-tissue]
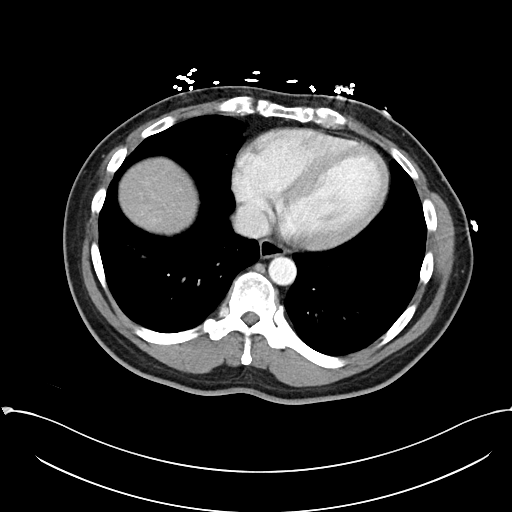

[Series 6: abdomen 3.0 mpr cor · coronal · 0.75mm/px · 3 of 102 slices shown]
[im 34/102  soft-tissue]
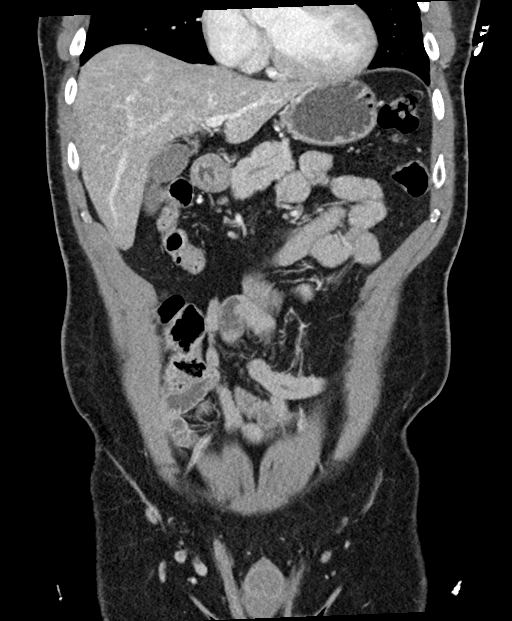
[im 45/102  soft-tissue]
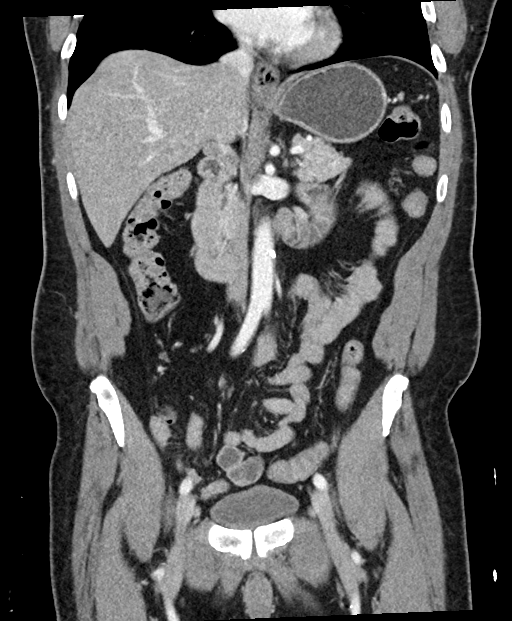
[im 57/102  soft-tissue]
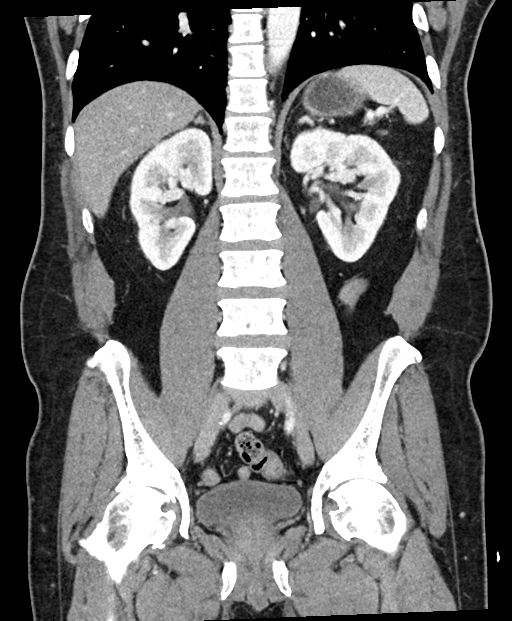

[16 of 46 positions shown; findings below may reference images not displayed]

FINDINGS: Lower chest: There is minimal subsegmental atelectasis at the lung
bases. Heart size is normal.

Hepatobiliary: There is focal fatty infiltration adjacent to the
falciform ligament. No suspicious liver lesions. No radiopaque
gallstones, biliary dilatation, or pericholecystic inflammatory
changes.

Pancreas: Unremarkable. No pancreatic ductal dilatation or
surrounding inflammatory changes.

Spleen: Normal in size without focal abnormality.

Adrenals/Urinary Tract: Adrenal glands are unremarkable. Kidneys are
normal, without renal calculi, focal lesion, or hydronephrosis.
Bladder is unremarkable.

Stomach/Bowel: Stomach is normal in appearance. Small bowel loops
are unremarkable. The appendix is well seen and has a normal
appearance. The colon is unremarkable.

Vascular/Lymphatic: There is atherosclerotic calcification of the
abdominal aorta, not associated with aneurysm. There is normal
vascular opacification of the celiac axis, superior mesenteric
artery, and inferior mesenteric artery. Normal appearance of the
portal venous system and inferior vena cava.

Reproductive: Prostate is unremarkable.

Other: No abdominal wall hernia or abnormality. No abdominopelvic
ascites.

Musculoskeletal: There are degenerative changes in both hips.
IMPRESSION: 1. No acute abnormality of the abdomen and pelvis.
2. Normal appendix.
3. No bowel obstruction or acute urinary tract abnormality.
4.  Aortic atherosclerosis.  ([id number].N)

## 2021-12-23 ENCOUNTER — Ambulatory Visit: Payer: No Typology Code available for payment source | Admitting: Internal Medicine

## 2022-01-09 ENCOUNTER — Other Ambulatory Visit: Payer: Self-pay

## 2022-01-09 ENCOUNTER — Ambulatory Visit (INDEPENDENT_AMBULATORY_CARE_PROVIDER_SITE_OTHER): Payer: Medicare Other

## 2022-01-09 ENCOUNTER — Ambulatory Visit (INDEPENDENT_AMBULATORY_CARE_PROVIDER_SITE_OTHER): Payer: Medicare Other | Admitting: Orthopaedic Surgery

## 2022-01-09 DIAGNOSIS — M1611 Unilateral primary osteoarthritis, right hip: Secondary | ICD-10-CM

## 2022-01-09 NOTE — Progress Notes (Signed)
Office Visit Note   Patient: Antonio Baldwin           Date of Birth: 1955-12-12           MRN: [id number] Visit Date: 01/09/2022              Requested by: Antonio Grandchild, MD [address] [place],  [place] [address] PCP: Antonio Grandchild, MD   Assessment & Plan: Visit Diagnoses:  1. Primary osteoarthritis of right hip     Plan: Impression is end-stage right hip DJD.  At this point he has achieved an acceptable A1c of 7.4 which was taken at the end of December.  Based on his options he still elects to move forward with a right total hip replacement.  Risk benefits rehab recovery prognosis reviewed with the patient detail.  Questions encouraged and answered.  Follow-Up Instructions: No follow-ups on file.   Orders:  Orders Placed This Encounter  Procedures   XR HIP UNILAT W OR W/O PELVIS 2-3 VIEWS RIGHT   No orders of the defined types were placed in this encounter.     Procedures: No procedures performed   Clinical Data: No additional findings.   Subjective: Chief Complaint  Patient presents with   Right Hip - Pain    HPI  Antonio Baldwin is a 66 year old gentleman who I saw about a year and a half ago for advanced right hip DJD.  At the time his A1c was too high for hip replacement surgery.  Since that time he has been working with his primary care doctor to get the A1c and diabetes under better control.  He is lost 25 pounds.  He continues to have severe right hip pain that is very limiting and affects his ability to perform ADLs.  He is limping as well.  He is still interested in having a hip replacement.  Review of Systems  Constitutional: Negative.   All other systems reviewed and are negative.   Objective: Vital Signs: There were no vitals taken for this visit.  Physical Exam Vitals and nursing note reviewed.  Constitutional:      Appearance: He is well-developed.  Pulmonary:     Effort: Pulmonary effort is normal.  Abdominal:     Palpations: Abdomen  is soft.  Skin:    General: Skin is warm.  Neurological:     Mental Status: He is alert and oriented to person, place, and time.  Psychiatric:        Behavior: Behavior normal.        Thought Content: Thought content normal.        Judgment: Judgment normal.    Ortho Exam  Examination of the right hip shows limited range of motion with severe pain with internal rotation and flexion past 70 degrees.  No sciatic tension signs.  Positive logroll and Stinchfield sign.  Specialty Comments:  No specialty comments available.  Imaging: XR HIP UNILAT W OR W/O PELVIS 2-3 VIEWS RIGHT  Result Date: 01/09/2022 Advanced degenerative joint disease of the right hip with bone-on-bone joint space narrowing.  Moderately severe left hip degenerative joint disease.    PMFS History: Patient Active Problem List   Diagnosis Date Noted   Diabetes 1.5, managed as type 2 (HCC) 06/23/2021   Low TSH level 06/23/2021   Hyperlipidemia with target LDL less than 100 06/20/2021   Primary hypertension 06/20/2021   Type II diabetes mellitus with manifestations (HCC) 06/20/2021   Prostate cancer screening 06/20/2021  Primary osteoarthritis of right hip 05/23/2020   Essential hypertension, benign 04/15/2013   Hyperlipidemia LDL goal < 100 04/15/2013   Type 1 diabetes mellitus on insulin therapy (HCC) 09/15/2012   ED (erectile dysfunction) 09/15/2012   Past Medical History:  Diagnosis Date   Allergy    Arthritis    Asthma    Diabetes mellitus without complication (HCC)    GERD (gastroesophageal reflux disease)    HTN (hypertension)     Family History  Problem Relation Age of Onset   Arthritis Other    Hypertension Other    Diabetes Other    Arthritis Mother    Hypertension Mother    Diabetes Mother    Heart disease Neg Hx    Early death Neg Hx    Stroke Neg Hx     Past Surgical History:  Procedure Laterality Date   TONSILLECTOMY AND ADENOIDECTOMY  11/24/1958   WISDOM TOOTH EXTRACTION      Social History   Occupational History   Not on file  Tobacco Use   Smoking status: Former    Types: Cigarettes    Quit date: 09/15/2002    Years since quitting: 19.3   Smokeless tobacco: Never  Substance and Sexual Activity   Alcohol use: No   Drug use: No   Sexual activity: Yes

## 2022-02-18 ENCOUNTER — Other Ambulatory Visit: Payer: Self-pay

## 2022-03-11 NOTE — Pre-Procedure Instructions (Addendum)
Surgical Instructions ? ? ? Your procedure is scheduled on Monday 03/24/22. ? ? Report to [facility] Main Entrance "A" at 10:05 A.M., then check in with the Admitting office. ? Call this number if you have problems the morning of surgery: ? [phone] ? ? If you have any questions prior to your surgery date call [phone]: Open Monday-Friday 8am-4pm ? ? ? Remember: ? Do not eat after midnight the night before your surgery ? ?You may drink clear liquids until 09:05 A.M. the morning of your surgery.   ?Clear liquids allowed are: Water, Non-Citrus Juices (without pulp), Carbonated Beverages, Clear Tea, Black Coffee ONLY (NO MILK, CREAM OR POWDERED CREAMER of any kind), and Gatorade ?  ? Take these medicines the morning of surgery with A SIP OF WATER:  ? amLODipine (NORVASC) ? montelukast (SINGULAIR)  ? omeprazole (PRILOSEC) ? rosuvastatin (CRESTOR) ? ? Take these medicines if needed:  ? albuterol (VENTOLIN HFA) 108 (90 Base) ? ?Please follow your surgeon's instructions on when to stop taking Aspirin. If you have not received instructions then please contact your surgeon's office for instructions.  ? ?As of today, STOP taking any Aspirin (unless otherwise instructed by your surgeon) meloxicam (MOBIC), Aleve, Naproxen, Ibuprofen, Motrin, Advil, Goody's, BC's, all herbal medications, fish oil, and all vitamins. ? ?WHAT DO I DO ABOUT MY DIABETES MEDICATION? ? ? ?Do not take oral diabetes medicines (pills) the morning of surgery. ? ?DO NOT TAKE empagliflozin (JARDIANCE) the day before surgery or the morning of surgery. ? ?DO NOT TAKE metFORMIN (GLUCOPHAGE) the morning of surgery.  ?   ?Take 15 units of insulin glargine (LANTUS) the morning of surgery.  This is half of your normal dose.  ? ?The day of surgery, do not take other diabetes injectables, including Byetta (exenatide), Bydureon (exenatide ER), Victoza (liraglutide), or Trulicity (dulaglutide). ? ?If your CBG is greater than 220 mg/dL, you may take ? of your  sliding scale (correction) dose of insulin. ? ? ?HOW TO MANAGE YOUR DIABETES ?BEFORE AND AFTER SURGERY ? ?Why is it important to control my blood sugar before and after surgery? ?Improving blood sugar levels before and after surgery helps healing and can limit problems. ?A way of improving blood sugar control is eating a healthy diet by: ? Eating less sugar and carbohydrates ? Increasing activity/exercise ? Talking with your doctor about reaching your blood sugar goals ?High blood sugars (greater than 180 mg/dL) can raise your risk of infections and slow your recovery, so you will need to focus on controlling your diabetes during the weeks before surgery. ?Make sure that the doctor who takes care of your diabetes knows about your planned surgery including the date and location. ? ?How do I manage my blood sugar before surgery? ?Check your blood sugar at least 4 times a day, starting 2 days before surgery, to make sure that the level is not too high or low. ? ?Check your blood sugar the morning of your surgery when you wake up and every 2 hours until you get to the Short Stay unit. ? ?If your blood sugar is less than 70 mg/dL, you will need to treat for low blood sugar: ?Do not take insulin. ?Treat a low blood sugar (less than 70 mg/dL) with ? cup of clear juice (cranberry or apple), 4 glucose tablets, OR glucose gel. ?Recheck blood sugar in 15 minutes after treatment (to make sure it is greater than 70 mg/dL). If your blood sugar is not greater than 70 mg/dL on  recheck, call [phone] for further instructions. ?Report your blood sugar to the short stay nurse when you get to Short Stay. ? ?If you are admitted to the hospital after surgery: ?Your blood sugar will be checked by the staff and you will probably be given insulin after surgery (instead of oral diabetes medicines) to make sure you have good blood sugar levels. ?The goal for blood sugar control after surgery is 80-180 mg/dL.  ?         ?Do not wear  jewelry or makeup ?Do not wear lotions, powders, perfumes/colognes, or deodorant. ?Do not shave 48 hours prior to surgery.  Men may shave face and neck. ?Do not bring valuables to the hospital. ?Do not wear nail polish, gel polish, artificial nails, or any other type of covering on natural nails (fingers and toes) ?If you have artificial nails or gel coating that need to be removed by a nail salon, please have this removed prior to surgery. Artificial nails or gel coating may interfere with anesthesia's ability to adequately monitor your vital signs. ? ?[facility] is not responsible for any belongings or valuables. .  ? ?Do NOT Smoke (Tobacco/Vaping)  24 hours prior to your procedure ? ?If you use a CPAP at night, you may bring your mask for your overnight stay. ?  ?Contacts, glasses, hearing aids, dentures or partials may not be worn into surgery, please bring cases for these belongings ?  ?For patients admitted to the hospital, discharge time will be determined by your treatment team. ?  ?Patients discharged the day of surgery will not be allowed to drive home, and someone needs to stay with them for 24 hours. ? ? ?SURGICAL WAITING ROOM VISITATION ?Patients having surgery or a procedure in a hospital may have two support people. ?Children under the age of 816 must have an adult with them who is not the patient. ?They may stay in the waiting area during the procedure and may switch out with other visitors. If the patient needs to stay at the hospital during part of their recovery, the visitor guidelines for inpatient rooms apply. ? ?Please refer to the [facility] website for the visitor guidelines for Inpatients (after your surgery is over and you are in a regular room).  ? ? ? ? ? ?Special instructions:   ? ?Oral Hygiene is also important to reduce your risk of infection.  Remember - BRUSH YOUR TEETH THE MORNING OF SURGERY WITH YOUR REGULAR TOOTHPASTE ? ? ?[facility]- Preparing For Surgery ? ?Before surgery, you  can play an important role. Because skin is not sterile, your skin needs to be as free of germs as possible. You can reduce the number of germs on your skin by washing with CHG (chlorahexidine gluconate) Soap before surgery.  CHG is an antiseptic cleaner which kills germs and bonds with the skin to continue killing germs even after washing.   ? ? ?Please do not use if you have an allergy to CHG or antibacterial soaps. If your skin becomes reddened/irritated stop using the CHG.  ?Do not shave (including legs and underarms) for at least 48 hours prior to first CHG shower. It is OK to shave your face. ? ?Please follow these instructions carefully. ?  ? ? Shower the NIGHT BEFORE SURGERY and the MORNING OF SURGERY with CHG Soap.  ? If you chose to wash your hair, wash your hair first as usual with your normal shampoo. After you shampoo, rinse your hair and body thoroughly to  remove the shampoo.  Then [employer] and genitals (private parts) with your normal soap and rinse thoroughly to remove soap. ? ?After that Use CHG Soap as you would any other liquid soap. You can apply CHG directly to the skin and wash gently with a scrungie or a clean washcloth.  ? ?Apply the CHG Soap to your body ONLY FROM THE NECK DOWN.  Do not use on open wounds or open sores. Avoid contact with your eyes, ears, mouth and genitals (private parts). Wash Face and genitals (private parts)  with your normal soap.  ? ?Wash thoroughly, paying special attention to the area where your surgery will be performed. ? ?Thoroughly rinse your body with warm water from the neck down. ? ?DO NOT shower/wash with your normal soap after using and rinsing off the CHG Soap. ? ?Pat yourself dry with a CLEAN TOWEL. ? ?Wear CLEAN PAJAMAS to bed the night before surgery ? ?Place CLEAN SHEETS on your bed the night before your surgery ? ?DO NOT SLEEP WITH PETS. ? ? ?Day of Surgery: ? ?Take a shower with CHG soap. ?Wear Clean/Comfortable clothing the morning of surgery ?Do  not apply any deodorants/lotions.   ?Remember to brush your teeth WITH YOUR REGULAR TOOTHPASTE. ? ? ? ?If you received a COVID test during your pre-op visit  it is requested that you wear a mask when out in publi

## 2022-03-12 ENCOUNTER — Other Ambulatory Visit: Payer: Self-pay

## 2022-03-12 ENCOUNTER — Encounter (HOSPITAL_COMMUNITY)
Admission: RE | Admit: 2022-03-12 | Discharge: 2022-03-12 | Disposition: A | Discharge status: Home / Self Care | Payer: Medicare Other | Source: Ambulatory Visit | Attending: Orthopaedic Surgery | Admitting: Orthopaedic Surgery

## 2022-03-12 ENCOUNTER — Encounter (HOSPITAL_COMMUNITY): Payer: Self-pay

## 2022-03-12 ENCOUNTER — Other Ambulatory Visit (HOSPITAL_COMMUNITY): Payer: Medicare Other

## 2022-03-12 VITALS — BP 129/66 | HR 77 | Temp 98.2°F | Resp 18 | Ht 66.0 in | Wt 177.0 lb

## 2022-03-12 DIAGNOSIS — Z01818 Encounter for other preprocedural examination: Secondary | ICD-10-CM | POA: Insufficient documentation

## 2022-03-12 LAB — BASIC METABOLIC PANEL
Anion gap: 7 (ref 5–15)
BUN: 11 mg/dL (ref 8–23)
CO2: 26 mmol/L (ref 22–32)
Calcium: 9.8 mg/dL (ref 8.9–10.3)
Chloride: 108 mmol/L (ref 98–111)
Creatinine, Ser: 0.85 mg/dL (ref 0.61–1.24)
GFR, Estimated: >60 mL/min (ref >60)
Glucose, Bld: 149 mg/dL — ABNORMAL HIGH (ref 70–99)
Potassium: 3.9 mmol/L (ref 3.5–5.1)
Sodium: 141 mmol/L (ref 135–145)

## 2022-03-12 LAB — TYPE AND SCREEN
ABO/RH(D): A POS
Antibody Screen: NEGATIVE

## 2022-03-12 LAB — CBC
HCT: 40.7 % (ref 39.0–52.0)
Hemoglobin: 13.3 g/dL (ref 13.0–17.0)
MCH: 29.8 pg (ref 26.0–34.0)
MCHC: 32.7 g/dL (ref 30.0–36.0)
MCV: 91.3 fL (ref 80.0–100.0)
Platelets: 268 10*3/uL (ref 150–400)
RBC: 4.46 MIL/uL (ref 4.22–5.81)
RDW: 13.2 % (ref 11.5–15.5)
WBC: 9.4 10*3/uL (ref 4.0–10.5)
nRBC: 0 % (ref 0.0–0.2)

## 2022-03-12 LAB — HEMOGLOBIN A1C
Hgb A1c MFr Bld: 6.7 % — ABNORMAL HIGH (ref 4.8–5.6)
Mean Plasma Glucose: 145.59 mg/dL

## 2022-03-12 LAB — GLUCOSE, CAPILLARY: Glucose-Capillary: 157 mg/dL — ABNORMAL HIGH (ref 70–99)

## 2022-03-12 LAB — SURGICAL PCR SCREEN

## 2022-03-12 NOTE — Progress Notes (Signed)
PCP - Dr. Sanda Linger ?Cardiologist - denies ? ?PPM/ICD - n/a ?  ?Chest x-ray - n/a ?EKG - 03/12/22 ?Stress Test - denies ?ECHO - denies ?Cardiac Cath - denies ? ?Sleep Study - denies ?CPAP - n/a ? ?CBG today- 157. Patient states he had kool-aid and a bologna sandwich earlier.  ?Checks Blood Sugar once a day.  ? ?Blood Thinner Instructions: n/a ?Aspirin Instructions: As of today stop taking Aspirin unless otherwise instructed by your surgeon. Please contact your surgeon's office if you have not received instructions.  ? ?ERAS Protcol - Clear liquids until 09:05 A.M. day of surgery.  ?PRE-SURGERY Ensure or G2-  ? ?COVID TEST- n/a ? ? ?Anesthesia review: No ? ?Patient denies shortness of breath, fever, cough and chest pain at PAT appointment ? ? ?All instructions explained to the patient, with a verbal understanding of the material. Patient agrees to go over the instructions while at home for a better understanding. Patient also instructed to self quarantine after being tested for COVID-19. The opportunity to ask questions was provided. ? ? ?

## 2022-03-17 ENCOUNTER — Other Ambulatory Visit: Payer: Self-pay

## 2022-03-17 ENCOUNTER — Other Ambulatory Visit: Payer: Self-pay | Admitting: Physician Assistant

## 2022-03-17 MED ORDER — OXYCODONE-ACETAMINOPHEN 5-325 MG PO TABS: 1.0000 | ORAL_TABLET | Freq: Four times a day (QID) | ORAL | 0 refills | Status: DC | PRN

## 2022-03-17 MED ORDER — METHOCARBAMOL 500 MG PO TABS: 500.0000 mg | ORAL_TABLET | Freq: Two times a day (BID) | ORAL | 2 refills | Status: DC | PRN

## 2022-03-17 MED ORDER — SULFAMETHOXAZOLE-TRIMETHOPRIM 800-160 MG PO TABS
1.0000 | ORAL_TABLET | Freq: Two times a day (BID) | ORAL | 0 refills | Status: DC
Start: 2022-03-17 — End: 2022-11-25

## 2022-03-17 MED ORDER — ONDANSETRON HCL 4 MG PO TABS: 4.0000 mg | ORAL_TABLET | Freq: Three times a day (TID) | ORAL | 0 refills | Status: DC | PRN

## 2022-03-17 MED ORDER — ASPIRIN EC 81 MG PO TBEC: 81.0000 mg | DELAYED_RELEASE_TABLET | Freq: Two times a day (BID) | ORAL | 0 refills | Status: DC

## 2022-03-17 MED ORDER — DOCUSATE SODIUM 100 MG PO CAPS: 100.0000 mg | ORAL_CAPSULE | Freq: Every day | ORAL | 2 refills | Status: AC | PRN

## 2022-03-21 MED ORDER — TRANEXAMIC ACID 1000 MG/10ML IV SOLN
2000.0000 mg | INTRAVENOUS | Status: DC
  Filled 2022-03-21: qty 20

## 2022-03-23 NOTE — Anesthesia Preprocedure Evaluation (Addendum)
Anesthesia Evaluation  ?Patient identified by MRN, date of birth, ID band ?Patient awake ? ? ? ?Reviewed: ?Allergy & Precautions, NPO status , Patient's Chart, lab work & pertinent test results ? ?History of Anesthesia Complications ?Negative for: history of anesthetic complications ? ?Airway ?Mallampati: II ? ?TM Distance: >3 FB ?Neck ROM: Full ? ? ? Dental ? ?(+) Edentulous Upper, Missing,  ?  ?Pulmonary ?asthma , former smoker,  ?  ?Pulmonary exam normal ? ? ? ? ? ? ? Cardiovascular ?hypertension, Pt. on medications ?Normal cardiovascular exam ? ? ?  ?Neuro/Psych ?negative neurological ROS ? negative psych ROS  ? GI/Hepatic ?GERD  ,(+)  ?  ? substance abuse ? marijuana use,   ?Endo/Other  ?diabetes, Type 1, Oral Hypoglycemic Agents, Insulin Dependent ? Renal/GU ?negative Renal ROS  ?negative genitourinary ?  ?Musculoskeletal ? ?(+) Arthritis ,  ? Abdominal ?  ?Peds ? Hematology ?negative hematology ROS ?(+)   ?Anesthesia Other Findings ?Day of surgery medications reviewed with patient. ? Reproductive/Obstetrics ?negative OB ROS ? ?  ? ? ? ? ? ? ? ? ? ? ? ? ? ?  ?  ? ? ? ? ? ? ? ?Anesthesia Physical ?Anesthesia Plan ? ?ASA: 2 ? ?Anesthesia Plan: Spinal  ? ?Post-op Pain Management: Tylenol PO (pre-op)*  ? ?Induction:  ? ?PONV Risk Score and Plan: 1 and Treatment may vary due to age or medical condition, Midazolam, Dexamethasone, Propofol infusion and Ondansetron ? ?Airway Management Planned: Natural Airway and Simple Face Mask ? ?Additional Equipment: None ? ?Intra-op Plan:  ? ?Post-operative Plan:  ? ?Informed Consent: I have reviewed the patients History and Physical, chart, labs and discussed the procedure including the risks, benefits and alternatives for the proposed anesthesia with the patient or authorized representative who has indicated his/her understanding and acceptance.  ? ? ? ? ? ?Plan Discussed with: CRNA ? ?Anesthesia Plan Comments:   ? ? ? ? ? ?Anesthesia Quick  Evaluation ? ?

## 2022-03-24 ENCOUNTER — Ambulatory Visit (HOSPITAL_BASED_OUTPATIENT_CLINIC_OR_DEPARTMENT_OTHER): Payer: No Typology Code available for payment source | Admitting: Anesthesiology

## 2022-03-24 ENCOUNTER — Ambulatory Visit (HOSPITAL_COMMUNITY): Payer: No Typology Code available for payment source

## 2022-03-24 ENCOUNTER — Encounter (HOSPITAL_COMMUNITY)
Admission: RE | Disposition: A | Discharge status: Home Health | Payer: Self-pay | Source: Home / Self Care | Attending: Orthopaedic Surgery

## 2022-03-24 ENCOUNTER — Observation Stay (HOSPITAL_COMMUNITY): Payer: No Typology Code available for payment source

## 2022-03-24 ENCOUNTER — Encounter (HOSPITAL_COMMUNITY): Payer: Self-pay | Admitting: Orthopaedic Surgery

## 2022-03-24 ENCOUNTER — Observation Stay (HOSPITAL_COMMUNITY)
Admission: RE | Admit: 2022-03-24 | Discharge: 2022-03-25 | Disposition: A | Discharge status: Home Health | Payer: No Typology Code available for payment source | Attending: Orthopaedic Surgery | Admitting: Orthopaedic Surgery

## 2022-03-24 ENCOUNTER — Other Ambulatory Visit: Payer: Self-pay

## 2022-03-24 ENCOUNTER — Ambulatory Visit (HOSPITAL_COMMUNITY): Payer: No Typology Code available for payment source | Admitting: Anesthesiology

## 2022-03-24 DIAGNOSIS — Z87891 Personal history of nicotine dependence: Secondary | ICD-10-CM | POA: Insufficient documentation

## 2022-03-24 DIAGNOSIS — M1611 Unilateral primary osteoarthritis, right hip: Secondary | ICD-10-CM

## 2022-03-24 DIAGNOSIS — Z7984 Long term (current) use of oral hypoglycemic drugs: Secondary | ICD-10-CM | POA: Insufficient documentation

## 2022-03-24 DIAGNOSIS — E119 Type 2 diabetes mellitus without complications: Secondary | ICD-10-CM | POA: Insufficient documentation

## 2022-03-24 DIAGNOSIS — Z794 Long term (current) use of insulin: Secondary | ICD-10-CM | POA: Diagnosis not present

## 2022-03-24 DIAGNOSIS — J45909 Unspecified asthma, uncomplicated: Secondary | ICD-10-CM | POA: Insufficient documentation

## 2022-03-24 DIAGNOSIS — Z7982 Long term (current) use of aspirin: Secondary | ICD-10-CM | POA: Insufficient documentation

## 2022-03-24 DIAGNOSIS — Z96641 Presence of right artificial hip joint: Secondary | ICD-10-CM

## 2022-03-24 DIAGNOSIS — Z79899 Other long term (current) drug therapy: Secondary | ICD-10-CM | POA: Insufficient documentation

## 2022-03-24 DIAGNOSIS — I10 Essential (primary) hypertension: Secondary | ICD-10-CM | POA: Diagnosis not present

## 2022-03-24 DIAGNOSIS — Z01818 Encounter for other preprocedural examination: Secondary | ICD-10-CM

## 2022-03-24 HISTORY — PX: TOTAL HIP ARTHROPLASTY: SHX124

## 2022-03-24 LAB — COMPREHENSIVE METABOLIC PANEL
ALT: 28 U/L (ref 0–44)
AST: 19 U/L (ref 15–41)
Albumin: 4.1 g/dL (ref 3.5–5.0)
Alkaline Phosphatase: 78 U/L (ref 38–126)
Anion gap: 8 (ref 5–15)
BUN: 12 mg/dL (ref 8–23)
CO2: 26 mmol/L (ref 22–32)
Calcium: 9.4 mg/dL (ref 8.9–10.3)
Chloride: 106 mmol/L (ref 98–111)
Creatinine, Ser: 0.79 mg/dL (ref 0.61–1.24)
GFR, Estimated: >60 mL/min (ref >60)
Glucose, Bld: 129 mg/dL — ABNORMAL HIGH (ref 70–99)
Potassium: 3.8 mmol/L (ref 3.5–5.1)
Sodium: 140 mmol/L (ref 135–145)
Total Bilirubin: 0.5 mg/dL (ref 0.3–1.2)
Total Protein: 7.6 g/dL (ref 6.5–8.1)

## 2022-03-24 LAB — GLUCOSE, CAPILLARY
Glucose-Capillary: 137 mg/dL — ABNORMAL HIGH (ref 70–99)
Glucose-Capillary: 111 mg/dL — ABNORMAL HIGH (ref 70–99)
Glucose-Capillary: 101 mg/dL — ABNORMAL HIGH (ref 70–99)
Glucose-Capillary: 114 mg/dL — ABNORMAL HIGH (ref 70–99)
Glucose-Capillary: 117 mg/dL — ABNORMAL HIGH (ref 70–99)

## 2022-03-24 LAB — ABO/RH: ABO/RH(D): A POS

## 2022-03-24 LAB — SURGICAL PCR SCREEN
MRSA, PCR: NEGATIVE
Staphylococcus aureus: NEGATIVE

## 2022-03-24 SURGERY — ARTHROPLASTY, HIP, TOTAL, ANTERIOR APPROACH
Anesthesia: Spinal | Site: Hip | Laterality: Right

## 2022-03-24 MED ORDER — OXYCODONE HCL 5 MG/5ML PO SOLN: 5.0000 mg | Freq: Once | ORAL | Status: DC | PRN

## 2022-03-24 MED ORDER — CEFAZOLIN SODIUM-DEXTROSE 2-4 GM/100ML-% IV SOLN
2.0000 g | INTRAVENOUS | Status: AC
  Administered 2022-03-24: 2 g via INTRAVENOUS
  Filled 2022-03-24: qty 100

## 2022-03-24 MED ORDER — CHLORHEXIDINE GLUCONATE 0.12 % MT SOLN
15.0000 mL | Freq: Once | OROMUCOSAL | Status: AC
  Administered 2022-03-24: 15 mL via OROMUCOSAL
  Filled 2022-03-24: qty 15

## 2022-03-24 MED ORDER — ALUM & MAG HYDROXIDE-SIMETH 200-200-20 MG/5ML PO SUSP: 30.0000 mL | ORAL | Status: DC | PRN

## 2022-03-24 MED ORDER — INSULIN ASPART 100 UNIT/ML IJ SOLN: 0.0000 [IU] | Freq: Every day | INTRAMUSCULAR | Status: DC

## 2022-03-24 MED ORDER — OXYCODONE HCL 5 MG PO TABS
10.0000 mg | ORAL_TABLET | ORAL | Status: DC | PRN
  Administered 2022-03-25: 10 mg via ORAL
  Filled 2022-03-24: qty 2

## 2022-03-24 MED ORDER — OXYCODONE HCL 5 MG PO TABS: 5.0000 mg | ORAL_TABLET | Freq: Once | ORAL | Status: DC | PRN

## 2022-03-24 MED ORDER — DIPHENHYDRAMINE HCL 12.5 MG/5ML PO ELIX: 25.0000 mg | ORAL_SOLUTION | ORAL | Status: DC | PRN

## 2022-03-24 MED ORDER — SEMAGLUTIDE(0.25 OR 0.5MG/DOS) 2 MG/1.5ML ~~LOC~~ SOPN: 0.5000 mg | PEN_INJECTOR | SUBCUTANEOUS | Status: DC

## 2022-03-24 MED ORDER — ONDANSETRON HCL 4 MG PO TABS: 4.0000 mg | ORAL_TABLET | Freq: Four times a day (QID) | ORAL | Status: DC | PRN

## 2022-03-24 MED ORDER — PROPOFOL 10 MG/ML IV BOLUS
INTRAVENOUS | Status: DC | PRN
  Administered 2022-03-24: 20 mg via INTRAVENOUS

## 2022-03-24 MED ORDER — HYDROMORPHONE HCL 1 MG/ML IJ SOLN: 0.5000 mg | INTRAMUSCULAR | Status: DC | PRN

## 2022-03-24 MED ORDER — ASPIRIN 81 MG PO CHEW
81.0000 mg | CHEWABLE_TABLET | Freq: Two times a day (BID) | ORAL | Status: DC
  Administered 2022-03-24 – 2022-03-25 (×2): 81 mg via ORAL
  Filled 2022-03-24 (×2): qty 1

## 2022-03-24 MED ORDER — INSULIN ASPART 100 UNIT/ML IJ SOLN: 0.0000 [IU] | Freq: Three times a day (TID) | INTRAMUSCULAR | Status: DC

## 2022-03-24 MED ORDER — PHENOL 1.4 % MT LIQD: 1.0000 | OROMUCOSAL | Status: DC | PRN

## 2022-03-24 MED ORDER — PHENYLEPHRINE HCL-NACL 20-0.9 MG/250ML-% IV SOLN
INTRAVENOUS | Status: DC | PRN
  Administered 2022-03-24: 40 ug/min via INTRAVENOUS

## 2022-03-24 MED ORDER — SODIUM CHLORIDE 0.9 % IV SOLN
INTRAVENOUS | Status: DC | PRN
  Administered 2022-03-24: 2000 mg via TOPICAL

## 2022-03-24 MED ORDER — ORAL CARE MOUTH RINSE: 15.0000 mL | Freq: Once | OROMUCOSAL | Status: AC

## 2022-03-24 MED ORDER — EMPAGLIFLOZIN 25 MG PO TABS
25.0000 mg | ORAL_TABLET | Freq: Every day | ORAL | Status: DC
  Administered 2022-03-25: 25 mg via ORAL
  Filled 2022-03-24: qty 1

## 2022-03-24 MED ORDER — DROPERIDOL 2.5 MG/ML IJ SOLN: 0.6250 mg | Freq: Once | INTRAMUSCULAR | Status: DC | PRN

## 2022-03-24 MED ORDER — INSULIN GLARGINE-YFGN 100 UNIT/ML ~~LOC~~ SOLN
50.0000 [IU] | Freq: Every day | SUBCUTANEOUS | Status: DC
Start: 2022-03-24 — End: 2022-03-25
  Administered 2022-03-25: 50 [IU] via SUBCUTANEOUS
  Filled 2022-03-24 (×3): qty 0.5

## 2022-03-24 MED ORDER — DEXAMETHASONE SODIUM PHOSPHATE 10 MG/ML IJ SOLN
10.0000 mg | Freq: Once | INTRAMUSCULAR | Status: AC
  Administered 2022-03-25: 10 mg via INTRAVENOUS
  Filled 2022-03-24: qty 1

## 2022-03-24 MED ORDER — PANTOPRAZOLE SODIUM 40 MG PO TBEC
40.0000 mg | DELAYED_RELEASE_TABLET | Freq: Every day | ORAL | Status: DC
  Administered 2022-03-24 – 2022-03-25 (×2): 40 mg via ORAL
  Filled 2022-03-24 (×2): qty 1

## 2022-03-24 MED ORDER — FENTANYL CITRATE (PF) 250 MCG/5ML IJ SOLN
INTRAMUSCULAR | Status: DC | PRN
  Administered 2022-03-24: 50 ug via INTRAVENOUS

## 2022-03-24 MED ORDER — METFORMIN HCL 500 MG PO TABS
500.0000 mg | ORAL_TABLET | Freq: Two times a day (BID) | ORAL | Status: DC
  Administered 2022-03-24 – 2022-03-25 (×2): 500 mg via ORAL
  Filled 2022-03-24 (×2): qty 1

## 2022-03-24 MED ORDER — DOCUSATE SODIUM 100 MG PO CAPS
100.0000 mg | ORAL_CAPSULE | Freq: Two times a day (BID) | ORAL | Status: DC
  Administered 2022-03-24 – 2022-03-25 (×2): 100 mg via ORAL
  Filled 2022-03-24 (×2): qty 1

## 2022-03-24 MED ORDER — MENTHOL 3 MG MT LOZG: 1.0000 | LOZENGE | OROMUCOSAL | Status: DC | PRN

## 2022-03-24 MED ORDER — LACTATED RINGERS IV SOLN: INTRAVENOUS | Status: DC

## 2022-03-24 MED ORDER — ONDANSETRON HCL 4 MG/2ML IJ SOLN
INTRAMUSCULAR | Status: DC | PRN
  Administered 2022-03-24: 4 mg via INTRAVENOUS

## 2022-03-24 MED ORDER — SORBITOL 70 % SOLN: 30.0000 mL | Freq: Every day | Status: DC | PRN

## 2022-03-24 MED ORDER — ONDANSETRON HCL 4 MG/2ML IJ SOLN: 4.0000 mg | Freq: Four times a day (QID) | INTRAMUSCULAR | Status: DC | PRN

## 2022-03-24 MED ORDER — ACETAMINOPHEN 500 MG PO TABS
1000.0000 mg | ORAL_TABLET | Freq: Four times a day (QID) | ORAL | Status: DC
Start: 2022-03-24 — End: 2022-03-25
  Administered 2022-03-24 – 2022-03-25 (×3): 1000 mg via ORAL
  Filled 2022-03-24 (×3): qty 2

## 2022-03-24 MED ORDER — FERROUS SULFATE 325 (65 FE) MG PO TABS
325.0000 mg | ORAL_TABLET | Freq: Three times a day (TID) | ORAL | Status: DC
  Administered 2022-03-24 – 2022-03-25 (×2): 325 mg via ORAL
  Filled 2022-03-24 (×2): qty 1

## 2022-03-24 MED ORDER — TRANEXAMIC ACID-NACL 1000-0.7 MG/100ML-% IV SOLN
1000.0000 mg | Freq: Once | INTRAVENOUS | Status: AC
  Administered 2022-03-24: 1000 mg via INTRAVENOUS
  Filled 2022-03-24: qty 100

## 2022-03-24 MED ORDER — METOCLOPRAMIDE HCL 5 MG PO TABS: 5.0000 mg | ORAL_TABLET | Freq: Three times a day (TID) | ORAL | Status: DC | PRN

## 2022-03-24 MED ORDER — VANCOMYCIN HCL 1 G IV SOLR
INTRAVENOUS | Status: DC | PRN
Start: 2022-03-24 — End: 2022-03-24
  Administered 2022-03-24: 1000 mg via TOPICAL

## 2022-03-24 MED ORDER — POLYETHYLENE GLYCOL 3350 17 G PO PACK
17.0000 g | PACK | Freq: Every day | ORAL | Status: DC
  Administered 2022-03-24 – 2022-03-25 (×2): 17 g via ORAL
  Filled 2022-03-24 (×2): qty 1

## 2022-03-24 MED ORDER — METOCLOPRAMIDE HCL 5 MG/ML IJ SOLN: 5.0000 mg | Freq: Three times a day (TID) | INTRAMUSCULAR | Status: DC | PRN

## 2022-03-24 MED ORDER — OXYCODONE HCL ER 10 MG PO T12A
10.0000 mg | EXTENDED_RELEASE_TABLET | Freq: Two times a day (BID) | ORAL | Status: DC
  Administered 2022-03-24 – 2022-03-25 (×2): 10 mg via ORAL
  Filled 2022-03-24 (×2): qty 1

## 2022-03-24 MED ORDER — CEFAZOLIN SODIUM-DEXTROSE 2-4 GM/100ML-% IV SOLN
2.0000 g | Freq: Four times a day (QID) | INTRAVENOUS | Status: AC
  Administered 2022-03-24 (×2): 2 g via INTRAVENOUS
  Filled 2022-03-24 (×2): qty 100

## 2022-03-24 MED ORDER — OXYCODONE HCL 5 MG PO TABS
5.0000 mg | ORAL_TABLET | ORAL | Status: DC | PRN
  Administered 2022-03-24 – 2022-03-25 (×2): 10 mg via ORAL
  Filled 2022-03-24 (×3): qty 2

## 2022-03-24 MED ORDER — MIDAZOLAM HCL 2 MG/2ML IJ SOLN
INTRAMUSCULAR | Status: DC | PRN
  Administered 2022-03-24: 2 mg via INTRAVENOUS

## 2022-03-24 MED ORDER — PHENYLEPHRINE 80 MCG/ML (10ML) SYRINGE FOR IV PUSH (FOR BLOOD PRESSURE SUPPORT)
PREFILLED_SYRINGE | INTRAVENOUS | Status: DC | PRN
  Administered 2022-03-24: 80 ug via INTRAVENOUS

## 2022-03-24 MED ORDER — 0.9 % SODIUM CHLORIDE (POUR BTL) OPTIME
TOPICAL | Status: DC | PRN
  Administered 2022-03-24: 1000 mL

## 2022-03-24 MED ORDER — MIDAZOLAM HCL 2 MG/2ML IJ SOLN
INTRAMUSCULAR | Status: AC
  Filled 2022-03-24: qty 2

## 2022-03-24 MED ORDER — SODIUM CHLORIDE 0.9 % IV SOLN: INTRAVENOUS | Status: DC

## 2022-03-24 MED ORDER — INSULIN ASPART 100 UNIT/ML IJ SOLN: 0.0000 [IU] | INTRAMUSCULAR | Status: DC | PRN

## 2022-03-24 MED ORDER — BUPIVACAINE IN DEXTROSE 0.75-8.25 % IT SOLN
INTRATHECAL | Status: DC | PRN
  Administered 2022-03-24: 1.6 mL via INTRATHECAL

## 2022-03-24 MED ORDER — PROPOFOL 500 MG/50ML IV EMUL
INTRAVENOUS | Status: DC | PRN
Start: 2022-03-24 — End: 2022-03-24
  Administered 2022-03-24: 75 ug/kg/min via INTRAVENOUS

## 2022-03-24 MED ORDER — ACETAMINOPHEN 325 MG PO TABS: 325.0000 mg | ORAL_TABLET | Freq: Four times a day (QID) | ORAL | Status: DC | PRN

## 2022-03-24 MED ORDER — VANCOMYCIN HCL 1000 MG IV SOLR
INTRAVENOUS | Status: AC
  Filled 2022-03-24: qty 20

## 2022-03-24 MED ORDER — BUPIVACAINE-MELOXICAM ER 400-12 MG/14ML IJ SOLN
INTRAMUSCULAR | Status: AC
  Filled 2022-03-24: qty 1

## 2022-03-24 MED ORDER — METHOCARBAMOL 1000 MG/10ML IJ SOLN
500.0000 mg | Freq: Four times a day (QID) | INTRAVENOUS | Status: DC | PRN
  Filled 2022-03-24: qty 5

## 2022-03-24 MED ORDER — AMLODIPINE BESYLATE 5 MG PO TABS
10.0000 mg | ORAL_TABLET | Freq: Every day | ORAL | Status: DC
  Administered 2022-03-25: 10 mg via ORAL
  Filled 2022-03-24: qty 2

## 2022-03-24 MED ORDER — TRANEXAMIC ACID-NACL 1000-0.7 MG/100ML-% IV SOLN
1000.0000 mg | INTRAVENOUS | Status: AC
  Administered 2022-03-24: 1000 mg via INTRAVENOUS
  Filled 2022-03-24: qty 100

## 2022-03-24 MED ORDER — FENTANYL CITRATE (PF) 250 MCG/5ML IJ SOLN
INTRAMUSCULAR | Status: AC
  Filled 2022-03-24: qty 5

## 2022-03-24 MED ORDER — METHOCARBAMOL 500 MG PO TABS
500.0000 mg | ORAL_TABLET | Freq: Four times a day (QID) | ORAL | Status: DC | PRN
  Administered 2022-03-24 (×2): 500 mg via ORAL
  Filled 2022-03-24 (×2): qty 1

## 2022-03-24 MED ORDER — ACETAMINOPHEN 500 MG PO TABS
1000.0000 mg | ORAL_TABLET | Freq: Once | ORAL | Status: AC
Start: 2022-03-24 — End: 2022-03-24
  Administered 2022-03-24: 1000 mg via ORAL
  Filled 2022-03-24: qty 2

## 2022-03-24 MED ORDER — FENTANYL CITRATE (PF) 100 MCG/2ML IJ SOLN
INTRAMUSCULAR | Status: AC
  Filled 2022-03-24: qty 2

## 2022-03-24 MED ORDER — FENTANYL CITRATE (PF) 100 MCG/2ML IJ SOLN
25.0000 ug | INTRAMUSCULAR | Status: DC | PRN
  Administered 2022-03-24: 50 ug via INTRAVENOUS

## 2022-03-24 MED ORDER — BUPIVACAINE-MELOXICAM ER 400-12 MG/14ML IJ SOLN
INTRAMUSCULAR | Status: DC | PRN
  Administered 2022-03-24: 400 mg

## 2022-03-24 MED ORDER — SODIUM CHLORIDE 0.9 % IR SOLN
Status: DC | PRN
  Administered 2022-03-24: 1000 mL

## 2022-03-24 MED ORDER — POVIDONE-IODINE 10 % EX SWAB
2.0000 "application " | Freq: Once | CUTANEOUS | Status: AC
  Administered 2022-03-24: 2 via TOPICAL

## 2022-03-24 MED ORDER — PRONTOSAN WOUND IRRIGATION OPTIME
TOPICAL | Status: DC | PRN
  Administered 2022-03-24: 1

## 2022-03-24 SURGICAL SUPPLY — 70 items
ADH SKN CLS APL DERMABOND .7 (GAUZE/BANDAGES/DRESSINGS) ×1
BAG COUNTER SPONGE SURGICOUNT (BAG) ×2 IMPLANT
BAG DECANTER FOR FLEXI CONT (MISCELLANEOUS) ×2 IMPLANT
BAG SPNG CNTER NS LX DISP (BAG) ×1
BALL HIP CERAMIC (Hips) IMPLANT
CELLS DAT CNTRL 66122 CELL SVR (MISCELLANEOUS) IMPLANT
COVER PERINEAL POST (MISCELLANEOUS) ×2 IMPLANT
COVER SURGICAL LIGHT HANDLE (MISCELLANEOUS) ×2 IMPLANT
CUP SECTOR GRIPTON 50MM (Cup) ×1 IMPLANT
DERMABOND ADVANCED (GAUZE/BANDAGES/DRESSINGS) ×1
DERMABOND ADVANCED .7 DNX12 (GAUZE/BANDAGES/DRESSINGS) IMPLANT
DRAPE C-ARM 42X72 X-RAY (DRAPES) ×2 IMPLANT
DRAPE POUCH INSTRU U-SHP 10X18 (DRAPES) ×2 IMPLANT
DRAPE STERI IOBAN 125X83 (DRAPES) ×2 IMPLANT
DRAPE U-SHAPE 47X51 STRL (DRAPES) ×4 IMPLANT
DRSG AQUACEL AG ADV 3.5X10 (GAUZE/BANDAGES/DRESSINGS) ×2 IMPLANT
DURAPREP 26ML APPLICATOR (WOUND CARE) ×4 IMPLANT
ELECT BLADE 4.0 EZ CLEAN MEGAD (MISCELLANEOUS) ×2
ELECT REM PT RETURN 9FT ADLT (ELECTROSURGICAL) ×2
ELECTRODE BLDE 4.0 EZ CLN MEGD (MISCELLANEOUS) ×1 IMPLANT
ELECTRODE REM PT RTRN 9FT ADLT (ELECTROSURGICAL) ×1 IMPLANT
GLOVE BIOGEL PI IND STRL 7.0 (GLOVE) ×1 IMPLANT
GLOVE BIOGEL PI IND STRL 7.5 (GLOVE) ×4 IMPLANT
GLOVE BIOGEL PI INDICATOR 7.0 (GLOVE) ×1
GLOVE BIOGEL PI INDICATOR 7.5 (GLOVE) ×4
GLOVE ECLIPSE 7.0 STRL STRAW (GLOVE) ×4 IMPLANT
GLOVE SKINSENSE NS SZ7.5 (GLOVE) ×1
GLOVE SKINSENSE STRL SZ7.5 (GLOVE) ×1 IMPLANT
GLOVE SURG UNDER POLY LF SZ7 (GLOVE) ×2 IMPLANT
GLOVE SURG UNDER POLY LF SZ7.5 (GLOVE) ×4 IMPLANT
GOWN STRL REIN XL XLG (GOWN DISPOSABLE) ×2 IMPLANT
GOWN STRL REUS W/ TWL LRG LVL3 (GOWN DISPOSABLE) IMPLANT
GOWN STRL REUS W/ TWL XL LVL3 (GOWN DISPOSABLE) ×1 IMPLANT
GOWN STRL REUS W/TWL LRG LVL3 (GOWN DISPOSABLE)
GOWN STRL REUS W/TWL XL LVL3 (GOWN DISPOSABLE) ×2
HANDPIECE INTERPULSE COAX TIP (DISPOSABLE) ×2
HIP BALL CERAMIC (Hips) ×2 IMPLANT
HOOD PEEL AWAY FLYTE STAYCOOL (MISCELLANEOUS) ×5 IMPLANT
IV NS IRRIG 3000ML ARTHROMATIC (IV SOLUTION) ×1 IMPLANT
JET LAVAGE IRRISEPT WOUND (IRRIGATION / IRRIGATOR)
KIT BASIN OR (CUSTOM PROCEDURE TRAY) ×2 IMPLANT
LAVAGE JET IRRISEPT WOUND (IRRIGATION / IRRIGATOR) ×1 IMPLANT
LINER ACET PNNCL PLUS4 NEUTRAL (Hips) IMPLANT
MARKER SKIN DUAL TIP RULER LAB (MISCELLANEOUS) ×2 IMPLANT
NDL SPNL 18GX3.5 QUINCKE PK (NEEDLE) ×1 IMPLANT
NEEDLE SPNL 18GX3.5 QUINCKE PK (NEEDLE) ×2 IMPLANT
PACK TOTAL JOINT (CUSTOM PROCEDURE TRAY) ×2 IMPLANT
PACK UNIVERSAL I (CUSTOM PROCEDURE TRAY) ×2 IMPLANT
PINNACLE PLUS 4 NEUTRAL (Hips) ×2 IMPLANT
RETRACTOR WND ALEXIS 18 MED (MISCELLANEOUS) IMPLANT
RTRCTR WOUND ALEXIS 18CM MED (MISCELLANEOUS)
SAW OSC TIP CART 19.5X105X1.3 (SAW) ×2 IMPLANT
SET HNDPC FAN SPRY TIP SCT (DISPOSABLE) ×1 IMPLANT
SOLUTION PRONTOSAN WOUND 350ML (IRRIGATION / IRRIGATOR) ×1 IMPLANT
STAPLER VISISTAT 35W (STAPLE) IMPLANT
STEM FEM ACTIS HIGH SZ3 (Stem) ×1 IMPLANT
SUT ETHIBOND 2 V 37 (SUTURE) ×2 IMPLANT
SUT VIC AB 0 CT1 27 (SUTURE) ×2
SUT VIC AB 0 CT1 27XBRD ANBCTR (SUTURE) ×1 IMPLANT
SUT VIC AB 1 CTX 36 (SUTURE) ×2
SUT VIC AB 1 CTX36XBRD ANBCTR (SUTURE) ×1 IMPLANT
SUT VIC AB 2-0 CT1 27 (SUTURE) ×4
SUT VIC AB 2-0 CT1 TAPERPNT 27 (SUTURE) ×2 IMPLANT
SYR 20CC LL (SYRINGE) ×1 IMPLANT
SYR 50ML LL SCALE MARK (SYRINGE) ×2 IMPLANT
TOWEL GREEN STERILE (TOWEL DISPOSABLE) ×2 IMPLANT
TRAY CATH 16FR W/PLASTIC CATH (SET/KITS/TRAYS/PACK) IMPLANT
TRAY FOLEY W/BAG SLVR 16FR (SET/KITS/TRAYS/PACK) ×2
TRAY FOLEY W/BAG SLVR 16FR ST (SET/KITS/TRAYS/PACK) ×1 IMPLANT
YANKAUER SUCT BULB TIP NO VENT (SUCTIONS) ×2 IMPLANT

## 2022-03-24 NOTE — Evaluation (Signed)
Physical Therapy Evaluation ?Patient Details ?Name: Antonio Baldwin. ?MRN: [id number] ?DOB: 1956-11-03 ?Today's Date: 03/24/2022 ? ?History of Present Illness ? Pt is a 66 y.o. M who presents s/p R THA 03/24/2022. Significant PMH: DM, HTN, asthma.  ?Clinical Impression ? Pt admitted s/p R THA. Pt is overall mobilizing well POD #0. Pt able to initiate reviewing HEP (written instruction provided). Pt ambulating 40 ft with a walker at a min guard assist level, utilizing a step through pattern. Pt with + vomiting at end of session (RN notified). Suspect excellent progress. ?   ? ?Recommendations for follow up therapy are one component of a multi-disciplinary discharge planning process, led by the attending physician.  Recommendations may be updated based on patient status, additional functional criteria and insurance authorization. ? ?Follow Up Recommendations Follow physician's recommendations for discharge plan and follow up therapies ? ?  ?Assistance Recommended at Discharge PRN  ?Patient can return home with the following ? Assistance with cooking/housework;Assist for transportation;Help with stairs or ramp for entrance ? ?  ?Equipment Recommendations Rolling walker (2 wheels)  ?Recommendations for Other Services ?    ?  ?Functional Status Assessment Patient has had a recent decline in their functional status and demonstrates the ability to make significant improvements in function in a reasonable and predictable amount of time.  ? ?  ?Precautions / Restrictions Precautions ?Precautions: Fall ?Restrictions ?Weight Bearing Restrictions: No ?RLE Weight Bearing: Weight bearing as tolerated  ? ?  ? ?Mobility ? Bed Mobility ?Overal bed mobility: Modified Independent ?  ?  ?  ?  ?  ?  ?  ?  ? ?Transfers ?Overall transfer level: Modified independent ?Equipment used: Rolling walker (2 wheels) ?  ?  ?  ?  ?  ?  ?  ?  ?  ? ?Ambulation/Gait ?Ambulation/Gait assistance: Min guard ?Gait Distance (Feet): 40 Feet ?Assistive device:  Rolling walker (2 wheels) ?Gait Pattern/deviations: Step-through pattern, Decreased stance time - right, Decreased stride length ?Gait velocity: decreased ?  ?  ?General Gait Details: Step through pattern, slightly increased trunk flexion, cues for looking up ? ?Stairs ?  ?  ?  ?  ?  ? ?Wheelchair Mobility ?  ? ?Modified Rankin (Stroke Patients Only) ?  ? ?  ? ?Balance Overall balance assessment: Mild deficits observed, not formally tested ?  ?  ?  ?  ?  ?  ?  ?  ?  ?  ?  ?  ?  ?  ?  ?  ?  ?  ?   ? ? ? ?Pertinent Vitals/Pain Pain Assessment ?Pain Assessment: Faces ?Faces Pain Scale: Hurts little more ?Pain Location: R hip ?Pain Descriptors / Indicators: Grimacing, Guarding ?Pain Intervention(s): Limited activity within patient's tolerance, Monitored during session, Premedicated before session  ? ? ?Home Living Family/patient expects to be discharged to:: Private residence ?Living Arrangements: Spouse/significant other ?Available Help at Discharge: Family ?Type of Home: House ?Home Access: Stairs to enter ?Entrance Stairs-Rails: Left ?Entrance Stairs-Number of Steps: 4 ?  ?Home Layout: One level ?Home Equipment: Gilmer Mor - single point ?   ?  ?Prior Function Prior Level of Function : Independent/Modified Independent ?  ?  ?  ?  ?  ?  ?  ?  ?  ? ? ?Hand Dominance  ?   ? ?  ?Extremity/Trunk Assessment  ? Upper Extremity Assessment ?Upper Extremity Assessment: Overall WFL for tasks assessed ?  ? ?Lower Extremity Assessment ?Lower Extremity Assessment: RLE deficits/detail ?RLE Deficits /  Details: s/p THA. Able to perform quad set, hip abduction, ankle dorsiflexion WFL ?  ? ?Cervical / Trunk Assessment ?Cervical / Trunk Assessment: Normal  ?Communication  ? Communication: No difficulties  ?Cognition Arousal/Alertness: Awake/alert ?Behavior During Therapy: [facility] for tasks assessed/performed ?Overall Cognitive Status: Within Functional Limits for tasks assessed ?  ?  ?  ?  ?  ?  ?  ?  ?  ?  ?  ?  ?  ?  ?  ?  ?  ?  ?  ? ?   ?General Comments   ? ?  ?Exercises Total Joint Exercises ?Ankle Circles/Pumps: Right, 20 reps, Supine ?Quad Sets: Right, 10 reps, Supine ?Hip ABduction/ADduction: Right, 10 reps, Supine  ? ?Assessment/Plan  ?  ?PT Assessment Patient needs continued PT services  ?PT Problem List Decreased strength;Decreased activity tolerance;Decreased balance;Decreased mobility;Pain ? ?   ?  ?PT Treatment Interventions DME instruction;Gait training;Stair training;Functional mobility training;Therapeutic activities;Therapeutic exercise;Balance training;Patient/family education   ? ?PT Goals (Current goals can be found in the Care Plan section)  ?Acute Rehab PT Goals ?Patient Stated Goal: improved walking ?PT Goal Formulation: With patient ?Time For Goal Achievement: 04/07/22 ?Potential to Achieve Goals: Good ? ?  ?Frequency 7X/week ?  ? ? ?Co-evaluation   ?  ?  ?  ?  ? ? ?  ?AM-PAC PT "6 Clicks" Mobility  ?Outcome Measure Help needed turning from your back to your side while in a flat bed without using bedrails?: None ?Help needed moving from lying on your back to sitting on the side of a flat bed without using bedrails?: None ?Help needed moving to and from a bed to a chair (including a wheelchair)?: None ?Help needed standing up from a chair using your arms (e.g., wheelchair or bedside chair)?: None ?Help needed to walk in hospital room?: A Little ?Help needed climbing 3-5 steps with a railing? : A Little ?6 Click Score: 22 ? ?  ?End of Session Equipment Utilized During Treatment: Gait belt ?Activity Tolerance: Patient tolerated treatment well ?Patient left: in chair;with call bell/phone within reach;with family/visitor present ?Nurse Communication: Mobility status ?PT Visit Diagnosis: Unsteadiness on feet (R26.81);Other abnormalities of gait and mobility (R26.89);Pain ?Pain - Right/Left: Right ?Pain - part of body: Hip ?  ? ?Time: 4782-9562 ?PT Time Calculation (min) (ACUTE ONLY): 22 min ? ? ?Charges:   PT Evaluation ?$PT Eval  Low Complexity: 1 Low ?  ?  ?   ? ? ?Lillia Pauls, PT, DPT ?Acute Rehabilitation Services ?Pager [phone] ?Office [phone] ? ? ?Carloine Ernestina Penna ?03/24/2022, 5:11 PM ? ?

## 2022-03-24 NOTE — Transfer of Care (Signed)
Immediate Anesthesia Transfer of Care Note ? ?Patient: Antonio Baldwin. ? ?Procedure(s) Performed: RIGHT TOTAL HIP REPLACEMENT (Right: Hip) ? ?Patient Location: PACU ? ?Anesthesia Type:MAC and Spinal ? ?Level of Consciousness: awake and drowsy ? ?Airway & Oxygen Therapy: Patient Spontanous Breathing ? ?Post-op Assessment: Report given to RN and Post -op Vital signs reviewed and stable ? ?Post vital signs: Reviewed and stable ? ?Last Vitals:  ?Vitals Value Taken Time  ?BP    ?Temp    ?Pulse    ?Resp    ?SpO2    ? ? ?Last Pain:  ?Vitals:  ? 03/24/22 1019  ?TempSrc:   ?PainSc: 0-No pain  ?   ? ?  ? ?Complications: No notable events documented. ?

## 2022-03-24 NOTE — Anesthesia Postprocedure Evaluation (Signed)
Anesthesia Post Note ? ?Patient: Antonio Baldwin. ? ?Procedure(s) Performed: RIGHT TOTAL HIP REPLACEMENT (Right: Hip) ? ?  ? ?Patient location during evaluation: PACU ?Anesthesia Type: Spinal ?Level of consciousness: awake and alert ?Pain management: pain level controlled ?Vital Signs Assessment: post-procedure vital signs reviewed and stable ?Respiratory status: spontaneous breathing, nonlabored ventilation and respiratory function stable ?Cardiovascular status: blood pressure returned to baseline ?Postop Assessment: no apparent nausea or vomiting, spinal receding, no headache and no backache ?Anesthetic complications: no ? ? ?No notable events documented. ? ?Last Vitals:  ?Vitals:  ? 03/24/22 1535 03/24/22 1557  ?BP: 125/61 124/63  ?Pulse: 64 62  ?Resp: 19 20  ?Temp: (!) 36.3 ?C 36.4 ?C  ?SpO2: 96% 100%  ?  ?Last Pain:  ?Vitals:  ? 03/24/22 1758  ?TempSrc:   ?PainSc: 2   ? ? ?  ?  ?  ?  ?  ?  ? ?Shanda Howells ? ? ? ? ?

## 2022-03-24 NOTE — TOC Initial Note (Signed)
Transition of Care (TOC) - Initial/Assessment Note  ? ? ?Patient Details  ?Name: Antonio Baldwin. ?MRN: [id number] ?Date of Birth: 02/03/1956 ? ?Transition of Care (TOC) CM/SW Contact:    ?Kingsley Plan, RN ?Phone Number: ?03/24/2022, 4:30 PM ? ?Clinical Narrative:                 ? ? ? Dr Roda Shutters office has arranged home health services with [facility].  ? ?[facility] staff will provide any needed DME  ? ? ?Transition of Care (TOC) Screening Note ? ? ?Patient Details  ?Name: Antonio Baldwin. ?Date of Birth: [birth year] ? ? ? ?Transition of Care Department [facility]) has reviewed patient and no TOC needs have been identified at this time. We will continue to monitor patient advancement through interdisciplinary progression rounds. If new patient transition needs arise, please place a TOC consult. ?  ?  ? ? ?Patient Goals and CMS Choice ?  ?  ?  ? ?Expected Discharge Plan and Services ?  ?  ?  ?  ?  ?                ?  ?  ?  ?  ?  ?  ?  ?  ?  ?  ? ?Prior Living Arrangements/Services ?  ?  ?  ?       ?  ?  ?  ?  ? ?Activities of Daily Living ?  ?  ? ?Permission Sought/Granted ?  ?  ?   ?   ?   ?   ? ?Emotional Assessment ?  ?  ?  ?  ?  ?  ? ?Admission diagnosis:  Status post total replacement of right hip [Z96.641] ?Patient Active Problem List  ? Diagnosis Date Noted  ? Status post total replacement of right hip 03/24/2022  ? Diabetes 1.5, managed as type 2 (HCC) 06/23/2021  ? Low TSH level 06/23/2021  ? Hyperlipidemia with target LDL less than 100 06/20/2021  ? Primary hypertension 06/20/2021  ? Type II diabetes mellitus with manifestations (HCC) 06/20/2021  ? Prostate cancer screening 06/20/2021  ? Primary osteoarthritis of right hip 05/23/2020  ? Essential hypertension, benign 04/15/2013  ? Hyperlipidemia LDL goal < 100 04/15/2013  ? Type 1 diabetes mellitus on insulin therapy (HCC) 09/15/2012  ? ED (erectile dysfunction) 09/15/2012  ? ?PCP:  Etta Grandchild, MD ?Pharmacy:   ?[id number] [place], [place] -  [address] AT [facility] ?[address] [place] [address] ?Phone: [phone] Fax: [phone] ? ?[facility] - [place], [place] - [address] ?[address] [place] [address] ?Phone: [phone] Fax: [phone] ? ? ? ? ?Social Determinants of Health (SDOH) Interventions ?  ? ?Readmission Risk Interventions ?   ? View : No data to display.  ?  ?  ?  ? ? ? ?

## 2022-03-24 NOTE — Discharge Instructions (Signed)

## 2022-03-24 NOTE — H&P (Signed)
? ? ?PREOPERATIVE H&P ? ?Chief Complaint: right hip degenerative joint disease ? ?HPI: ?Antonio Baldwin. is a 66 y.o. male who presents for surgical treatment of right hip degenerative joint disease.  He denies any changes in medical history. ? ?Past Medical History:  ?Diagnosis Date  ? Allergy   ? Arthritis   ? Asthma   ? Diabetes mellitus without complication (HCC)   ? GERD (gastroesophageal reflux disease)   ? HTN (hypertension)   ? ?Past Surgical History:  ?Procedure Laterality Date  ? TONSILLECTOMY AND ADENOIDECTOMY  11/24/1958  ? WISDOM TOOTH EXTRACTION    ? ?Social History  ? ?Socioeconomic History  ? Marital status: Married  ?  Spouse name: Not on file  ? Number of children: Not on file  ? Years of education: Not on file  ? Highest education level: Not on file  ?Occupational History  ? Not on file  ?Tobacco Use  ? Smoking status: Former  ?  Types: Cigarettes  ?  Quit date: 09/15/2002  ?  Years since quitting: 19.5  ? Smokeless tobacco: Never  ?Vaping Use  ? Vaping Use: Never used  ?Substance and Sexual Activity  ? Alcohol use: No  ? Drug use: Yes  ?  Types: Marijuana  ?  Comment: Once or twice a week  ? Sexual activity: Yes  ?Other Topics Concern  ? Not on file  ?Social History Narrative  ? Not on file  ? ?Social Determinants of Health  ? ?Financial Resource Strain: Not on file  ?Food Insecurity: Not on file  ?Transportation Needs: Not on file  ?Physical Activity: Not on file  ?Stress: Not on file  ?Social Connections: Not on file  ? ?Family History  ?Problem Relation Age of Onset  ? Arthritis Other   ? Hypertension Other   ? Diabetes Other   ? Arthritis Mother   ? Hypertension Mother   ? Diabetes Mother   ? Heart disease Neg Hx   ? Early death Neg Hx   ? Stroke Neg Hx   ? ?No Known Allergies ?Prior to Admission medications   ?Medication Sig Start Date End Date Taking? Authorizing Provider  ?amLODipine (NORVASC) 10 MG tablet Take 10 mg by mouth daily.   Yes [provider]  ?docusate sodium  (COLACE) 100 MG capsule Take 1 capsule (100 mg total) by mouth daily as needed. 03/17/22 03/17/23 Yes Cristie Hem, PA-C  ?empagliflozin (JARDIANCE) 25 MG TABS tablet Take 25 mg by mouth daily.   Yes [provider]  ?insulin glargine (LANTUS) 100 UNIT/ML injection Inject 0.5 mLs (50 Units total) into the skin daily. ?Patient taking differently: Inject 30 Units into the skin daily. 04/15/13  Yes Etta Grandchild, MD  ?meloxicam (MOBIC) 15 MG tablet Take 15 mg by mouth daily.   Yes [provider]  ?metFORMIN (GLUCOPHAGE) 500 MG tablet Take 500 mg by mouth 2 (two) times daily with a meal.   Yes [provider]  ?methocarbamol (ROBAXIN) 500 MG tablet Take 1 tablet (500 mg total) by mouth 2 (two) times daily as needed. To be taken after surgery 03/17/22  Yes Cristie Hem, PA-C  ?montelukast (SINGULAIR) 10 MG tablet Take 10 mg by mouth daily.   Yes [provider]  ?omeprazole (PRILOSEC) 20 MG capsule Take 20 mg by mouth daily.   Yes [provider]  ?ondansetron (ZOFRAN) 4 MG tablet Take 1 tablet (4 mg total) by mouth every 8 (eight) hours as needed for  nausea or vomiting. 03/17/22  Yes Cristie Hem, PA-C  ?oxyCODONE-acetaminophen (PERCOCET) 5-325 MG tablet Take 1-2 tablets by mouth every 6 (six) hours as needed. To be taken after surgery 03/17/22  Yes Cristie Hem, PA-C  ?rosuvastatin (CRESTOR) 20 MG tablet Take 1 tablet (20 mg total) by mouth daily. 06/21/21  Yes Etta Grandchild, MD  ?Semaglutide,0.25 or 0.5MG /DOS, (OZEMPIC, 0.25 OR 0.5 MG/DOSE,) 2 MG/1.5ML SOPN Inject 0.5 mg into the skin every Sunday.   Yes [provider]  ?albuterol (VENTOLIN HFA) 108 (90 Base) MCG/ACT inhaler Inhale 1-2 puffs into the lungs every 4 (four) hours as needed for wheezing or shortness of breath.    [provider]  ?aspirin EC 81 MG tablet Take 1 tablet (81 mg total) by mouth 2 (two) times daily. To be taken after surgery to prevent blood clots 03/17/22   Cristie Hem, PA-C  ?sulfamethoxazole-trimethoprim (BACTRIM DS) 800-160 MG tablet Take 1 tablet by mouth 2 (two) times daily. To be taken after surgery 03/17/22   Cristie Hem, PA-C  ? ? ? ?Positive ROS: All other systems have been reviewed and were otherwise negative with the exception of those mentioned in the HPI and as above. ? ?Physical Exam: ?General: Alert, no acute distress ?Cardiovascular: No pedal edema ?Respiratory: No cyanosis, no use of accessory musculature ?GI: abdomen soft ?Skin: No lesions in the area of chief complaint ?Neurologic: Sensation intact distally ?Psychiatric: Patient is competent for consent with normal mood and affect ?Lymphatic: no lymphedema ? ?MUSCULOSKELETAL: exam stable ? ?Assessment: ?right hip degenerative joint disease ? ?Plan: ?Plan for Procedure(s): ?RIGHT TOTAL HIP ARTHROPLASTY ANTERIOR APPROACH ? ?The risks benefits and alternatives were discussed with the patient including but not limited to the risks of nonoperative treatment, versus surgical intervention including infection, bleeding, nerve injury,  blood clots, cardiopulmonary complications, morbidity, mortality, among others, and they were willing to proceed.  ? ?Preoperative templating of the joint replacement has been completed, documented, and submitted to the Operating Room personnel in order to optimize intra-operative equipment management. ? ? ?Glee Arvin, MD ?03/24/2022 ?10:20 AM ? ?

## 2022-03-24 NOTE — Op Note (Signed)
? ?RIGHT TOTAL HIP REPLACEMENT  Procedure Note ?Antonio Baldwin.   [id number] ? ?Pre-op Diagnosis: right hip degenerative joint disease ?    ?Post-op Diagnosis: same ?  ?Operative Procedures  ?1. Total hip replacement; Right hip; uncemented [id number]  ? ?Surgeon: Frankey Shown, M.D. ? ?Assist: Madalyn Rob, PA-C ?  ?Anesthesia: spinal ? ?Prosthesis: Depuy ?Acetabulum: Pinnacle 50 mm ?Femur: Actis 3 HO ?Head: 32 mm size: +5 ?Liner: +4 ?Bearing Type: ceramic/poly ? ?Total Hip Arthroplasty (Anterior Approach) Op Note:  ?After informed consent was obtained and the operative extremity marked in the holding area, the patient was brought back to the operating room and placed supine on the HANA table. Next, the operative extremity was prepped and draped in normal sterile fashion. Surgical timeout occurred verifying patient identification, surgical site, surgical procedure and administration of antibiotics.  ?A modified anterior Smith-Peterson approach to the hip was performed, using the interval between tensor fascia lata and sartorius.  Dissection was carried bluntly down onto the anterior hip capsule. The lateral femoral circumflex vessels were identified and coagulated. A capsulotomy was performed and the capsular flaps tagged for later repair.  The neck osteotomy was performed. The femoral head was removed which showed degenerative wear, the acetabular rim was cleared of soft tissue and attention was turned to reaming the acetabulum.  ?Sequential reaming was performed under fluoroscopic guidance. We reamed to a size 50 mm, and then impacted the acetabular shell.  The liner was then placed after irrigation and attention turned to the femur.  ?After placing the femoral hook, the leg was taken to externally rotated, extended and adducted position taking care to perform soft tissue releases to allow for adequate mobilization of the femur. Soft tissue was cleared from the shoulder of the greater trochanter and the  hook elevator used to improve exposure of the proximal femur. Sequential broaching performed up to a size 3. Trial neck and head were placed. The leg was brought back up to neutral and the construct reduced.  Antibiotic irrigation was placed in the surgical wound and kept for at least 1 minute.  The position and sizing of components, offset and leg lengths were checked using fluoroscopy. Stability of the construct was checked in extension and external rotation without any subluxation or impingement of prosthesis. We dislocated the prosthesis, dropped the leg back into position, removed trial components, and irrigated copiously. The final stem and head was then placed, the leg brought back up, the system reduced and fluoroscopy used to verify positioning.  ?We irrigated, obtained hemostasis and closed the capsule using #2 ethibond suture.  One gram of vancomycin powder was placed in the surgical bed.   One gram of topical tranexamic acid was injected into the joint.  The fascia was closed with #1 vicryl plus, the deep fat layer was closed with 0 vicryl, the subcutaneous layers closed with 2.0 Vicryl Plus and the skin closed with 2.0 nylon and dermabond. A sterile dressing was applied. The patient was awakened in the operating room and taken to recovery in stable condition.  ?All sponge, needle, and instrument counts were correct at the end of the case.  ? ?Tawanna Cooler, my PA, was a medical necessity for opening, closing, limb positioning, retracting, exposing, and overall facilitation and timely completion of the surgery. ? ?Position: supine  ?Complications: see description of procedure.  ?Time Out: performed  ? ?Drains/Packing: none ? ?Estimated blood loss: see anesthesia record ? ?Returned to Recovery Room: in good condition.  ? ?  Antibiotics: yes  ? ?Mechanical VTE (DVT) Prophylaxis: sequential compression devices, TED thigh-high  ?Chemical VTE (DVT) Prophylaxis: aspirin  ? ?Fluid Replacement: see anesthesia  record ? ?Specimens Removed: 1 to pathology  ? ?Sponge and Instrument Count Correct? yes  ? ?PACU: portable radiograph - low AP  ? ?Plan/RTC: Return in 2 weeks for staple removal. ?Weight Bearing/Load Lower Extremity: full  ?Hip precautions: none ?Suture Removal: 2 weeks  ? ?N. Eduard Roux, MD ?Marga Hoots ?2:04 PM ? ? ?Implant Name Type Inv. Item Serial No. Manufacturer Lot No. LRB No. Used Action  ?CUP SECTOR GRIPTON 50MM - [id number] Cup CUP SECTOR GRIPTON 50MM  [employer] [id number] Right 1 Implanted  ?PINNACLE PLUS 4 NEUTRAL - [id number] Hips PINNACLE PLUS 4 NEUTRAL  [employer] [id number] Right 1 Implanted  ?STEM FEM ACTIS HIGH SZ3 - [id number] Stem STEM FEM ACTIS HIGH SZ3  [employer] [id number] Right 1 Implanted  ?HIP BALL CERAMIC - [id number] Hips HIP BALL CERAMIC  [employer] [id number] Right 1 Implanted  ? ? ?

## 2022-03-24 NOTE — Anesthesia Procedure Notes (Signed)
Spinal ? ?Patient location during procedure: OR ?Start time: 03/24/2022 12:50 PM ?End time: 03/24/2022 12:53 PM ?Reason for block: surgical anesthesia ?Staffing ?Performed: anesthesiologist  ?Anesthesiologist: Brennan Bailey, MD ?Preanesthetic Checklist ?Completed: patient identified, IV checked, risks and benefits discussed, surgical consent, monitors and equipment checked, pre-op evaluation and timeout performed ?Spinal Block ?Patient position: sitting ?Prep: DuraPrep and site prepped and draped ?Patient monitoring: continuous pulse ox, blood pressure and heart rate ?Approach: midline ?Location: L3-4 ?Injection technique: single-shot ?Needle ?Needle type: Pencan  ?Needle gauge: 24 G ?Needle length: 9 cm ?Assessment ?Events: CSF return ?Additional Notes ?Risks, benefits, and alternative discussed. Patient gave consent to procedure. Prepped and draped in sitting position. Patient sedated but responsive to voice. Clear CSF obtained after one needle pass. Positive terminal aspiration. No pain or paraesthesias with injection. Patient tolerated procedure well. Vital signs stable. Tawny Asal, MD ? ? ? ? ?

## 2022-03-24 NOTE — Anesthesia Procedure Notes (Signed)
Procedure Name: Wolsey ?Date/Time: 03/24/2022 12:50 PM ?Performed by: Dorann Lodge, CRNA ?Pre-anesthesia Checklist: Patient identified, Emergency Drugs available, Suction available and Patient being monitored ?Patient Re-evaluated:Patient Re-evaluated prior to induction ?Oxygen Delivery Method: Simple face mask ? ? ? ? ?

## 2022-03-25 ENCOUNTER — Encounter (HOSPITAL_COMMUNITY): Payer: Self-pay | Admitting: Orthopaedic Surgery

## 2022-03-25 DIAGNOSIS — M1611 Unilateral primary osteoarthritis, right hip: Secondary | ICD-10-CM | POA: Diagnosis not present

## 2022-03-25 LAB — CBC
HCT: 34.6 % — ABNORMAL LOW (ref 39.0–52.0)
Hemoglobin: 11.7 g/dL — ABNORMAL LOW (ref 13.0–17.0)
MCH: 30.3 pg (ref 26.0–34.0)
MCHC: 33.8 g/dL (ref 30.0–36.0)
MCV: 89.6 fL (ref 80.0–100.0)
Platelets: 209 10*3/uL (ref 150–400)
RBC: 3.86 MIL/uL — ABNORMAL LOW (ref 4.22–5.81)
RDW: 13 % (ref 11.5–15.5)
WBC: 16.1 10*3/uL — ABNORMAL HIGH (ref 4.0–10.5)
nRBC: 0 % (ref 0.0–0.2)

## 2022-03-25 LAB — GLUCOSE, CAPILLARY
Glucose-Capillary: 108 mg/dL — ABNORMAL HIGH (ref 70–99)
Glucose-Capillary: 149 mg/dL — ABNORMAL HIGH (ref 70–99)

## 2022-03-25 NOTE — Progress Notes (Signed)
Physical Therapy Treatment ?Patient Details ?Name: Antonio Baldwin. ?MRN: [id number] ?DOB: 22-Jun-1956 ?Today's Date: 03/25/2022 ? ? ?History of Present Illness Pt is a 66 y.o. M who presents s/p R THA 03/24/2022. Significant PMH: DM, HTN, asthma. ? ?  ?PT Comments  ? ? Pt progressing well. Pt with good carry over of HEP and was able to ambulate safely with RW and complete stair negotiation to safely enter home. Pt however did have an episode of diaphoresis, with nausea, and dry heaving during end of ambulation. BP taken at 177/94, then dropped to 144/71 s/p sitting x . Linsey, PA present and suspects its related to the oxycodone he just took. PT to return later today to work with pt again to assess vitals.  ?   ?Recommendations for follow up therapy are one component of a multi-disciplinary discharge planning process, led by the attending physician.  Recommendations may be updated based on patient status, additional functional criteria and insurance authorization. ? ?Follow Up Recommendations ? Follow physician's recommendations for discharge plan and follow up therapies ?  ?  ?Assistance Recommended at Discharge PRN  ?Patient can return home with the following Assistance with cooking/housework;Assist for transportation;Help with stairs or ramp for entrance ?  ?Equipment Recommendations ? Rolling walker (2 wheels)  ?  ?Recommendations for Other Services   ? ? ?  ?Precautions / Restrictions Precautions ?Precautions: Fall ?Restrictions ?Weight Bearing Restrictions: Yes ?RLE Weight Bearing: Weight bearing as tolerated  ?  ? ?Mobility ? Bed Mobility ?Overal bed mobility: Modified Independent ?  ?  ?  ?  ?  ?  ?General bed mobility comments: pt educated on long sit position and adduction of R LE with quad set to bring LEs off EOB instead of rotating R LE and dragging it across ?  ? ?Transfers ?Overall transfer level: Modified independent ?Equipment used: Rolling walker (2 wheels) ?  ?  ?  ?  ?  ?  ?  ?  ?   ? ?Ambulation/Gait ?Ambulation/Gait assistance: Min guard ?Gait Distance (Feet): 150 Feet ?Assistive device: Rolling walker (2 wheels) ?Gait Pattern/deviations: Step-through pattern, Decreased stance time - right, Decreased stride length ?Gait velocity: decreased ?  ?  ?General Gait Details: Step through pattern, slightly increased trunk flexion, cues for looking up, on ambulation back to room PT provided tactile cues at R posterior hip to promote extension and at chest to minimize trunk flexion to obtain optimal upright postition during ambulation. Pt with onset of diaphoresis, nausea, and dry heaving, Pt with bp of 177/94, Antonio Layman, PA present. suspect in response to just receiving oxycodone prior to PT and not eating very mouch. s/p sitting x 3 min BP 144/71 ? ? ?Stairs ?Stairs: Yes ?Stairs assistance: Min guard ?Stair Management: One rail Left, Step to pattern, Forwards ?Number of Stairs: 12 ?General stair comments: verbal cues for sequencing "up with the good, down with the bad" pt with good return demonstration ? ? ?Wheelchair Mobility ?  ? ?Modified Rankin (Stroke Patients Only) ?  ? ? ?  ?Balance Overall balance assessment: Mild deficits observed, not formally tested ?  ?  ?  ?  ?  ?  ?  ?  ?  ?  ?  ?  ?  ?  ?  ?  ?  ?  ?  ? ?  ?Cognition Arousal/Alertness: Awake/alert ?Behavior During Therapy: [facility] for tasks assessed/performed ?Overall Cognitive Status: Within Functional Limits for tasks assessed ?  ?  ?  ?  ?  ?  ?  ?  ?  ?  ?  ?  ?  ?  ?  ?  ?  ?  ?  ? ?  ?  Exercises Total Joint Exercises ?Heel Slides: AAROM, Right, 10 reps, Supine ?Hip ABduction/ADduction: Right, 10 reps, Supine ?Bridges: AROM, Both, 10 reps, Seated ? ?  ?General Comments General comments (skin integrity, edema, etc.): as noted in ambulation section, pt with episode of diaphoresis, nausea, and dry heaving, BP 177/94, linsey PA aware, after sitting bpt 144/71 ?  ?  ? ?Pertinent Vitals/Pain Pain Assessment ?Pain Assessment: 0-10 ?Faces Pain  Scale: Hurts little more ?Pain Location: R hip ?Pain Descriptors / Indicators: Discomfort ?Pain Intervention(s): Monitored during session  ? ? ?Home Living   ?  ?  ?  ?  ?  ?  ?  ?  ?  ?   ?  ?Prior Function    ?  ?  ?   ? ?PT Goals (current goals can now be found in the care plan section) Acute Rehab PT Goals ?Patient Stated Goal: home ?PT Goal Formulation: With patient ?Time For Goal Achievement: 04/07/22 ?Potential to Achieve Goals: Good ?Progress towards PT goals: Progressing toward goals ? ?  ?Frequency ? ? ? 7X/week ? ? ? ?  ?PT Plan Current plan remains appropriate  ? ? ?Co-evaluation   ?  ?  ?  ?  ? ?  ?AM-PAC PT "6 Clicks" Mobility   ?Outcome Measure ? Help needed turning from your back to your side while in a flat bed without using bedrails?: None ?Help needed moving from lying on your back to sitting on the side of a flat bed without using bedrails?: None ?Help needed moving to and from a bed to a chair (including a wheelchair)?: None ?Help needed standing up from a chair using your arms (e.g., wheelchair or bedside chair)?: None ?Help needed to walk in hospital room?: A Little ?Help needed climbing 3-5 steps with a railing? : A Little ?6 Click Score: 22 ? ?  ?End of Session Equipment Utilized During Treatment: Gait belt ?Activity Tolerance:  (limited by onset of diaphoresis) ?Patient left: with call bell/phone within reach;in bed ?Nurse Communication: Mobility status ?PT Visit Diagnosis: Unsteadiness on feet (R26.81);Other abnormalities of gait and mobility (R26.89);Pain ?Pain - Right/Left: Right ?Pain - part of body: Hip ?  ? ? ?Time: 8182-9937 ?PT Time Calculation (min) (ACUTE ONLY): 25 min ? ?Charges:  $Gait Training: 8-22 mins ?$Therapeutic Exercise: 8-22 mins          ?          ? ?Lewis Shock, PT, DPT ?Acute Rehabilitation Services ?Secure chat preferred ?Office #: [phone] ? ? ? ?Elizabella Nolet M Mannie Ohlin ?03/25/2022, 9:32 AM ? ?

## 2022-03-25 NOTE — Progress Notes (Signed)
Subjective: ?1 Day Post-Op Procedure(s) (LRB): ?RIGHT TOTAL HIP REPLACEMENT (Right) ?Patient reports pain as mild.  Patient became nauseous/diaphoretic with PT.  Felt much better once seated for a few minutes.  ? ?Objective: ?Vital signs in last 24 hours: ?Temp:  [97.4 ?F (36.3 ?C)-100.7 ?F (38.2 ?C)] 99.5 ?F (37.5 ?C) (05/02 0725) ?Pulse Rate:  [58-86] 86 (05/02 0725) ?Resp:  [12-20] 18 (05/02 0725) ?BP: (108-154)/(59-76) 144/71 (05/02 0758) ?SpO2:  [93 %-100 %] 100 % (05/02 0725) ?Weight:  [78.5 kg] 78.5 kg (05/01 0953) ? ?Intake/Output from previous day: ?05/01 0701 - 05/02 0700 ?In: 1901.5 [P.O.:360; I.V.:1087.5; IV Piggyback:454] ?Out: 950 [Urine:800; Blood:150] ?Intake/Output this shift: ?No intake/output data recorded. ? ?Recent Labs  ?  03/25/22 ?0612  ?HGB 11.7*  ? ?Recent Labs  ?  03/25/22 ?0612  ?WBC 16.1*  ?RBC 3.86*  ?HCT 34.6*  ?PLT 209  ? ?Recent Labs  ?  03/24/22 ?1020  ?NA 140  ?K 3.8  ?CL 106  ?CO2 26  ?BUN 12  ?CREATININE 0.79  ?GLUCOSE 129*  ?CALCIUM 9.4  ? ?No results for input(s): LABPT, INR in the last 72 hours. ? ?Neurologically intact ?Neurovascular intact ?Sensation intact distally ?Intact pulses distally ?Dorsiflexion/Plantar flexion intact ?Incision: dressing C/D/I ?No cellulitis present ?Compartment soft ? ? ?Assessment/Plan: ?1 Day Post-Op Procedure(s) (LRB): ?RIGHT TOTAL HIP REPLACEMENT (Right) ?Advance diet ?Up with therapy ?D/C IV fluids ?WBAT RLE ?ABLA- mild and stable ?D/c home after second PT session as long as he is feeling well and vitals are stable ? ? ? ? ? ?Cristie Hem ?03/25/2022, 8:03 AM ? ?

## 2022-03-25 NOTE — Progress Notes (Signed)
Physical Therapy Treatment ?Patient Details ?Name: Antonio Baldwin. ?MRN: [id number] ?DOB: 29-Jul-1956 ?Today's Date: 03/25/2022 ? ? ?History of Present Illness Pt is a 66 y.o. M who presents s/p R THA 03/24/2022. Significant PMH: DM, HTN, asthma. ? ?  ?PT Comments  ? ? Pt ambulated with supervision with RW. Pt with no diaphoresis, nausea, or vomiting this session. Discussed making sure to eat something when taking his pain medicine. Acute PT to cont to follow.   ?Recommendations for follow up therapy are one component of a multi-disciplinary discharge planning process, led by the attending physician.  Recommendations may be updated based on patient status, additional functional criteria and insurance authorization. ? ?Follow Up Recommendations ? Follow physician's recommendations for discharge plan and follow up therapies ?  ?  ?Assistance Recommended at Discharge PRN  ?Patient can return home with the following Assistance with cooking/housework;Assist for transportation;Help with stairs or ramp for entrance ?  ?Equipment Recommendations ? Rolling walker (2 wheels)  ?  ?Recommendations for Other Services   ? ? ?  ?Precautions / Restrictions Precautions ?Precautions: Fall ?Restrictions ?Weight Bearing Restrictions: Yes ?RLE Weight Bearing: Weight bearing as tolerated  ?  ? ?Mobility ? Bed Mobility ?Overal bed mobility: Modified Independent ?  ?  ?  ?  ?  ?  ?General bed mobility comments: pt with good carry over from this AMs instruction ?  ? ?Transfers ?Overall transfer level: Modified independent ?Equipment used: Rolling walker (2 wheels) ?  ?  ?  ?  ?  ?  ?  ?General transfer comment: verbal cues to push up from bed not pull up on walker ?  ? ?Ambulation/Gait ?Ambulation/Gait assistance: Min guard ?Gait Distance (Feet): 150 Feet ?Assistive device: Rolling walker (2 wheels) ?Gait Pattern/deviations: Step-through pattern, Decreased stance time - right, Decreased stride length ?Gait velocity: decreased ?  ?  ?General  Gait Details: pt with more upright posture today and incresaed step length and improved fluidity ? ? ?Stairs ?Stairs: Yes ?Stairs assistance: Min guard ?Stair Management: One rail Left, Step to pattern, Forwards ?Number of Stairs: 12 ?General stair comments: verbal cues for sequencing "up with the good, down with the bad" pt with good return demonstration ? ? ?Wheelchair Mobility ?  ? ?Modified Rankin (Stroke Patients Only) ?  ? ? ?  ?Balance Overall balance assessment: Mild deficits observed, not formally tested ?  ?  ?  ?  ?  ?  ?  ?  ?  ?  ?  ?  ?  ?  ?  ?  ?  ?  ?  ? ?  ?Cognition Arousal/Alertness: Awake/alert ?Behavior During Therapy: [facility] for tasks assessed/performed ?Overall Cognitive Status: Within Functional Limits for tasks assessed ?  ?  ?  ?  ?  ?  ?  ?  ?  ?  ?  ?  ?  ?  ?  ?  ?  ?  ?  ? ?  ?Exercises Total Joint Exercises ?Heel Slides: AAROM, Right, 10 reps, Supine ?Hip ABduction/ADduction: Right, 10 reps, Supine ?Bridges: AROM, Both, 10 reps, Seated ? ?  ?General Comments General comments (skin integrity, edema, etc.): VSS, no diaphoresis , nausea, vomiting ?  ?  ? ?Pertinent Vitals/Pain Pain Assessment ?Pain Assessment: 0-10 ?Faces Pain Scale: Hurts little more ?Pain Location: R hip ?Pain Descriptors / Indicators: Discomfort ?Pain Intervention(s): Monitored during session  ? ? ?Home Living   ?  ?  ?  ?  ?  ?  ?  ?  ?  ?   ?  ?  Prior Function    ?  ?  ?   ? ?PT Goals (current goals can now be found in the care plan section) Acute Rehab PT Goals ?Patient Stated Goal: home ?PT Goal Formulation: With patient ?Time For Goal Achievement: 04/07/22 ?Potential to Achieve Goals: Good ?Progress towards PT goals: Progressing toward goals ? ?  ?Frequency ? ? ? 7X/week ? ? ? ?  ?PT Plan Current plan remains appropriate  ? ? ?Co-evaluation   ?  ?  ?  ?  ? ?  ?AM-PAC PT "6 Clicks" Mobility   ?Outcome Measure ? Help needed turning from your back to your side while in a flat bed without using bedrails?: None ?Help  needed moving from lying on your back to sitting on the side of a flat bed without using bedrails?: None ?Help needed moving to and from a bed to a chair (including a wheelchair)?: None ?Help needed standing up from a chair using your arms (e.g., wheelchair or bedside chair)?: None ?Help needed to walk in hospital room?: A Little ?Help needed climbing 3-5 steps with a railing? : A Little ?6 Click Score: 22 ? ?  ?End of Session Equipment Utilized During Treatment: Gait belt ?Activity Tolerance: Patient tolerated treatment well (limited by onset of diaphoresis) ?Patient left: with call bell/phone within reach;in chair ?Nurse Communication: Mobility status ?PT Visit Diagnosis: Unsteadiness on feet (R26.81);Other abnormalities of gait and mobility (R26.89);Pain ?Pain - Right/Left: Right ?Pain - part of body: Hip ?  ? ? ?Time: 9038-3338 ?PT Time Calculation (min) (ACUTE ONLY): 11 min ? ?Charges:  $Gait Training: 8-22 mins          ?          ? ?Lewis Shock, PT, DPT ?Acute Rehabilitation Services ?Secure chat preferred ?Office #: [phone] ? ? ? ?[name] ?03/25/2022, 1:11 PM ? ?

## 2022-03-25 NOTE — Discharge Summary (Signed)
Patient ID: ?Antonio Baldwin. ?MRN: [id number] ?DOB/AGE: September 09, 1956 66 y.o. ? ?Admit date: 03/24/2022 ?Discharge date: 03/25/2022 ? ?Admission Diagnoses:  ?Principal Problem: ?  Primary osteoarthritis of right hip ?Active Problems: ?  Status post total replacement of right hip ? ? ?Discharge Diagnoses:  ?Same ? ?Past Medical History:  ?Diagnosis Date  ? Allergy   ? Arthritis   ? Asthma   ? Diabetes mellitus without complication (HCC)   ? GERD (gastroesophageal reflux disease)   ? HTN (hypertension)   ? ? ?Surgeries: Procedure(s): ?RIGHT TOTAL HIP REPLACEMENT on 03/24/2022 ?  ?Consultants:  ? ?Discharged Condition: Improved ? ?Hospital Course: Teyon Odette. is an 66 y.o. male who was admitted 03/24/2022 for operative treatment ofPrimary osteoarthritis of right hip. Patient has severe unremitting pain that affects sleep, daily activities, and work/hobbies. After pre-op clearance the patient was taken to the operating room on 03/24/2022 and underwent  Procedure(s): ?RIGHT TOTAL HIP REPLACEMENT.   ? ?Patient was given perioperative antibiotics:  ?Anti-infectives (From admission, onward)  ? ? Start     Dose/Rate Route Frequency Ordered Stop  ? 03/24/22 1600  ceFAZolin (ANCEF) IVPB 2g/100 mL premix       ? 2 g ?200 mL/hr over 30 Minutes Intravenous Every 6 hours 03/24/22 1550 03/24/22 2231  ? 03/24/22 1340  vancomycin (VANCOCIN) powder  Status:  Discontinued       ?   As needed 03/24/22 1341 03/24/22 1424  ? 03/24/22 1000  ceFAZolin (ANCEF) IVPB 2g/100 mL premix       ? 2 g ?200 mL/hr over 30 Minutes Intravenous On call to O.R. 03/24/22 0946 03/24/22 1252  ? ?  ?  ? ?Patient was given sequential compression devices, early ambulation, and chemoprophylaxis to prevent DVT. ? ?Patient benefited maximally from hospital stay and there were no complications.   ? ?Recent vital signs: Patient Vitals for the past 24 hrs: ? BP Temp Temp src Pulse Resp SpO2 Height Weight  ?03/25/22 0758 (!) 144/71 -- -- -- -- -- -- --  ?03/25/22 0725  (!) 143/64 99.5 ?F (37.5 ?C) Oral 86 18 100 % -- --  ?03/25/22 0355 (!) 127/59 99.3 ?F (37.4 ?C) Oral 80 20 94 % -- --  ?03/24/22 2319 118/60 (!) 100.7 ?F (38.2 ?C) Oral 86 18 97 % -- --  ?03/24/22 1948 123/62 99.9 ?F (37.7 ?C) Oral 81 18 93 % -- --  ?03/24/22 1557 124/63 97.6 ?F (36.4 ?C) Oral 62 20 100 % -- --  ?03/24/22 1535 125/61 (!) 97.4 ?F (36.3 ?C) -- 64 19 96 % -- --  ?03/24/22 1520 119/61 -- -- (!) 58 17 100 % -- --  ?03/24/22 1505 129/61 -- -- (!) 58 15 98 % -- --  ?03/24/22 1450 128/65 -- -- (!) 58 12 98 % -- --  ?03/24/22 1435 108/64 (!) 97.4 ?F (36.3 ?C) -- (!) 58 12 97 % -- --  ?03/24/22 0953 (!) 154/76 97.7 ?F (36.5 ?C) Oral 73 17 100 % 5\' 6"  (1.676 m) 78.5 kg  ?  ? ?Recent laboratory studies:  ?Recent Labs  ?  03/24/22 ?1020 03/25/22 ?0612  ?WBC  --  16.1*  ?HGB  --  11.7*  ?HCT  --  34.6*  ?PLT  --  209  ?NA 140  --   ?K 3.8  --   ?CL 106  --   ?CO2 26  --   ?BUN 12  --   ?CREATININE 0.79  --   ?  GLUCOSE 129*  --   ?CALCIUM 9.4  --   ? ? ? ?Discharge Medications:   ?Allergies as of 03/25/2022   ?No Known Allergies ?  ? ?  ?Medication List  ?  ? ?STOP taking these medications   ? ?meloxicam 15 MG tablet ?Commonly known as: MOBIC ?  ? ?  ? ?TAKE these medications   ? ?albuterol 108 (90 Base) MCG/ACT inhaler ?Commonly known as: VENTOLIN HFA ?Inhale 1-2 puffs into the lungs every 4 (four) hours as needed for wheezing or shortness of breath. ?  ?amLODipine 10 MG tablet ?Commonly known as: NORVASC ?Take 10 mg by mouth daily. ?  ?aspirin EC 81 MG tablet ?Take 1 tablet (81 mg total) by mouth 2 (two) times daily. To be taken after surgery to prevent blood clots ?  ?docusate sodium 100 MG capsule ?Commonly known as: Colace ?Take 1 capsule (100 mg total) by mouth daily as needed. ?  ?empagliflozin 25 MG Tabs tablet ?Commonly known as: JARDIANCE ?Take 25 mg by mouth daily. ?  ?insulin glargine 100 UNIT/ML injection ?Commonly known as: LANTUS ?Inject 0.5 mLs (50 Units total) into the skin daily. ?What changed: how  much to take ?  ?metFORMIN 500 MG tablet ?Commonly known as: GLUCOPHAGE ?Take 500 mg by mouth 2 (two) times daily with a meal. ?  ?methocarbamol 500 MG tablet ?Commonly known as: Robaxin ?Take 1 tablet (500 mg total) by mouth 2 (two) times daily as needed. To be taken after surgery ?  ?montelukast 10 MG tablet ?Commonly known as: SINGULAIR ?Take 10 mg by mouth daily. ?  ?omeprazole 20 MG capsule ?Commonly known as: PRILOSEC ?Take 20 mg by mouth daily. ?  ?ondansetron 4 MG tablet ?Commonly known as: Zofran ?Take 1 tablet (4 mg total) by mouth every 8 (eight) hours as needed for nausea or vomiting. ?  ?oxyCODONE-acetaminophen 5-325 MG tablet ?Commonly known as: Percocet ?Take 1-2 tablets by mouth every 6 (six) hours as needed. To be taken after surgery ?  ?Ozempic (0.25 or 0.5 MG/DOSE) 2 MG/1.5ML Sopn ?Generic drug: Semaglutide(0.25 or 0.5MG /DOS) ?Inject 0.5 mg into the skin every Sunday. ?  ?rosuvastatin 20 MG tablet ?Commonly known as: CRESTOR ?Take 1 tablet (20 mg total) by mouth daily. ?  ?sulfamethoxazole-trimethoprim 800-160 MG tablet ?Commonly known as: BACTRIM DS ?Take 1 tablet by mouth 2 (two) times daily. To be taken after surgery ?  ? ?  ? ?  ?  ? ? ?  ?Durable Medical Equipment  ?(From admission, onward)  ?  ? ? ?  ? ?  Start     Ordered  ? 03/24/22 1600  DME Walker rolling  Once       ?Question:  Patient needs a walker to treat with the following condition  Answer:  History of hip replacement  ? 03/24/22 1559  ? 03/24/22 1600  DME 3 n 1  Once       ? 03/24/22 1559  ? 03/24/22 1600  DME Bedside commode  Once       ?Question:  Patient needs a bedside commode to treat with the following condition  Answer:  History of hip replacement  ? 03/24/22 1559  ? ?  ?  ? ?  ? ? ?Diagnostic Studies: DG Pelvis Portable ? ?Result Date: 03/24/2022 ?CLINICAL DATA:  Status post right hip arthroplasty EXAM: PORTABLE PELVIS 1-2 VIEWS COMPARISON:  06/08/2021 CT FINDINGS: Right hip arthroplasty. No hardware complication or acute  periprosthetic fracture. Moderate left hip joint space  narrowing involving the weight-bearing surface. Vascular calcifications. IMPRESSION: Expected appearance after right hip arthroplasty. Electronically Signed   By: Jeronimo Greaves M.D.   On: 03/24/2022 15:49  ? ?DG C-Arm 1-60 Min-No Report ? ?Result Date: 03/24/2022 ?Fluoroscopy was utilized by the requesting physician.  No radiographic interpretation.  ? ?DG C-Arm 1-60 Min-No Report ? ?Result Date: 03/24/2022 ?Fluoroscopy was utilized by the requesting physician.  No radiographic interpretation.  ? ?DG HIP UNILAT WITH PELVIS 1V RIGHT ? ?Result Date: 03/24/2022 ?CLINICAL DATA:  Fluoroscopy for total right hip arthroplasty. EXAM: DG HIP (WITH OR WITHOUT PELVIS) 1V RIGHT COMPARISON:  Pelvis and right hip radiographs 01/09/2022 FINDINGS: Images were performed intraoperatively without the presence of a radiologist. The patient is undergoing total right hip arthroplasty. Total fluoroscopy images: 6 Total fluoroscopy time: 21 seconds Total dose: Radiation Exposure Index (as provided by the fluoroscopic device): 3.14 mGy air Kerma Please see intraoperative findings for further detail. IMPRESSION: Intraoperative fluoroscopy for total right hip arthroplasty. Electronically Signed   By: Neita Garnet M.D.   On: 03/24/2022 14:11   ? ?Disposition:  ? ? ? ? Follow-up Information   ? ? Tarry Kos, MD. Schedule an appointment as soon as possible for a visit in 2 week(s).   ?Specialty: Orthopedic Surgery ?Contact information: ?[address] ?[place] [address] ?[phone] ? ? ?  ?  ? ? [facility] Follow up.   ?Specialty: Home Health Services ?Contact information: ?[address] ?[address] ?[place] [address] ?[phone] ? ? ?  ?  ? ?  ?  ? ?  ? ? ? ?Signed: ?Cristie Hem ?03/25/2022, 8:04 AM ? ?  ? ?

## 2022-03-25 NOTE — Plan of Care (Signed)
Pt doing well. Pt given D/C instructions with verbal understanding. Rx's were sent to the pharmacy by MD. Pt's incision is clean and dry with no sign of infection. Pt's IV was removed prior to D/C. Pt received RW from [employer] per MD order. Pt D/C'd home via wheelchair per MD order. Pt is stable @ D/C and has no other needs at this time. Nekia Maxham, RN  

## 2022-04-08 ENCOUNTER — Ambulatory Visit (INDEPENDENT_AMBULATORY_CARE_PROVIDER_SITE_OTHER): Payer: Medicare Other | Admitting: Physician Assistant

## 2022-04-08 ENCOUNTER — Encounter: Payer: Self-pay | Admitting: Orthopaedic Surgery

## 2022-04-08 DIAGNOSIS — Z96641 Presence of right artificial hip joint: Secondary | ICD-10-CM

## 2022-04-08 NOTE — Progress Notes (Signed)
? ?Post-Op Visit Note ?  ?Patient: Antonio Baldwin.           ?Date of Birth: Jul 19, 1956           ?MRN: [id number] ?Visit Date: 04/08/2022 ?PCP: Janith Lima, MD ? ? ?Assessment & Plan: ? ?Chief Complaint:  ?Chief Complaint  ?Patient presents with  ? Right Hip - Routine Post Op  ? ?Visit Diagnoses:  ?1. History of total replacement of right hip   ? ? ?Plan: Patient is a pleasant 66 year old gentleman who comes in today 2 weeks status post right total hip replacement 03/24/2022.  He has been doing great.  He is not taking any narcotic pain medication.  He has been compliant taking a baby aspirin twice daily for DVT prophylaxis.  He has recently finished home health physical therapy and is ambulating with a cane out in public but unassisted at home.  Examination of the right hip reveals a fully healed surgical incision with nylon sutures in place.  No evidence of infection or cellulitis.  Calf is soft nontender.  He is neurovascularly intact distally.  Today, sutures were removed and Steri-Strips applied.  He will continue his baby aspirin twice daily for another 4 weeks for DVT prophylaxis.  Dental prophylaxis reinforced.  Follow-up in 4 weeks time for repeat evaluation and AP pelvis x-rays.  Call with concerns or questions. ? ? ? ? ?Follow-Up Instructions: Return in about 4 weeks (around 05/06/2022).  ? ?Orders:  ?No orders of the defined types were placed in this encounter. ? ?No orders of the defined types were placed in this encounter. ? ? ?Imaging: ?No new imaging ? ?PMFS History: ?Patient Active Problem List  ? Diagnosis Date Noted  ? Status post total replacement of right hip 03/24/2022  ? Diabetes 1.5, managed as type 2 ([place]) 06/23/2021  ? Low TSH level 06/23/2021  ? Hyperlipidemia with target LDL less than 100 06/20/2021  ? Primary hypertension 06/20/2021  ? Type II diabetes mellitus with manifestations ([place]) 06/20/2021  ? Prostate cancer screening 06/20/2021  ? Primary osteoarthritis of right hip  05/23/2020  ? Essential hypertension, benign 04/15/2013  ? Hyperlipidemia LDL goal < 100 04/15/2013  ? Type 1 diabetes mellitus on insulin therapy ([place]) 09/15/2012  ? ED (erectile dysfunction) 09/15/2012  ? ?Past Medical History:  ?Diagnosis Date  ? Allergy   ? Arthritis   ? Asthma   ? Diabetes mellitus without complication ([place])   ? GERD (gastroesophageal reflux disease)   ? HTN (hypertension)   ?  ?Family History  ?Problem Relation Age of Onset  ? Arthritis Other   ? Hypertension Other   ? Diabetes Other   ? Arthritis Mother   ? Hypertension Mother   ? Diabetes Mother   ? Heart disease Neg Hx   ? Early death Neg Hx   ? Stroke Neg Hx   ?  ?Past Surgical History:  ?Procedure Laterality Date  ? TONSILLECTOMY AND ADENOIDECTOMY  11/24/1958  ? 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  ? Procedure: RIGHT TOTAL HIP REPLACEMENT;  Surgeon: Leandrew Koyanagi, MD;  Location: [facility];  Service: Orthopedics;  Laterality: Right;  ? WISDOM TOOTH EXTRACTION    ? ?Social History  ? ?Occupational History  ? Not on file  ?Tobacco Use  ? Smoking status: Former  ?  Types: Cigarettes  ?  Quit date: 09/15/2002  ?  Years since quitting: 19.5  ? Smokeless tobacco: Never  ?Vaping Use  ? Vaping Use: Never  used  ?Substance and Sexual Activity  ? Alcohol use: No  ? Drug use: Yes  ?  Types: Marijuana  ?  Comment: Once or twice a week  ? Sexual activity: Yes  ? ? ? ?

## 2022-05-07 ENCOUNTER — Ambulatory Visit (INDEPENDENT_AMBULATORY_CARE_PROVIDER_SITE_OTHER): Payer: Medicare Other | Admitting: Physician Assistant

## 2022-05-07 ENCOUNTER — Ambulatory Visit (INDEPENDENT_AMBULATORY_CARE_PROVIDER_SITE_OTHER): Payer: No Typology Code available for payment source

## 2022-05-07 DIAGNOSIS — Z96641 Presence of right artificial hip joint: Secondary | ICD-10-CM | POA: Diagnosis not present

## 2022-05-07 NOTE — Progress Notes (Signed)
   Post-Op Visit Note   Patient: Antonio Baldwin.           Date of Birth: 1956/11/08           MRN: [id number] Visit Date: 05/07/2022 PCP: Etta Grandchild, MD   Assessment & Plan:  Chief Complaint: No chief complaint on file.  Visit Diagnoses:  1. History of total hip replacement, right     Plan: Patient is a pleasant 66 year old gentleman who comes in today 6 weeks status post right total hip replacement, date of surgery 03/24/2022.  He has been doing well.  No pain.  He has been compliant with his baby aspirin twice daily.  Examination right hip reveals painless logroll and hip flexion.  He is neurovascular intact distally.  At this point, he may discontinue his baby aspirin.  Dental prophylaxis reinforced.  Increase activity as tolerated.  Follow-up in 6 weeks for recheck.  Follow-Up Instructions: Return in about 6 weeks (around 06/18/2022).   Orders:  Orders Placed This Encounter  Procedures   XR Pelvis 1-2 Views   No orders of the defined types were placed in this encounter.   Imaging: No results found.  PMFS History: Patient Active Problem List   Diagnosis Date Noted   Status post total replacement of right hip 03/24/2022   Diabetes 1.5, managed as type 2 (HCC) 06/23/2021   Low TSH level 06/23/2021   Hyperlipidemia with target LDL less than 100 06/20/2021   Primary hypertension 06/20/2021   Type II diabetes mellitus with manifestations (HCC) 06/20/2021   Prostate cancer screening 06/20/2021   Primary osteoarthritis of right hip 05/23/2020   Essential hypertension, benign 04/15/2013   Hyperlipidemia LDL goal < 100 04/15/2013   Type 1 diabetes mellitus on insulin therapy (HCC) 09/15/2012   ED (erectile dysfunction) 09/15/2012   Past Medical History:  Diagnosis Date   Allergy    Arthritis    Asthma    Diabetes mellitus without complication (HCC)    GERD (gastroesophageal reflux disease)    HTN (hypertension)     Family History  Problem Relation Age of  Onset   Arthritis Other    Hypertension Other    Diabetes Other    Arthritis Mother    Hypertension Mother    Diabetes Mother    Heart disease Neg Hx    Early death Neg Hx    Stroke Neg Hx     Past Surgical History:  Procedure Laterality Date   TONSILLECTOMY AND ADENOIDECTOMY  11/24/1958   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   Procedure: RIGHT TOTAL HIP REPLACEMENT;  Surgeon: Tarry Kos, MD;  Location: [facility];  Service: Orthopedics;  Laterality: Right;   WISDOM TOOTH EXTRACTION     Social History   Occupational History   Not on file  Tobacco Use   Smoking status: Former    Types: Cigarettes    Quit date: 09/15/2002    Years since quitting: 19.6   Smokeless tobacco: Never  Vaping Use   Vaping Use: Never used  Substance and Sexual Activity   Alcohol use: No   Drug use: Yes    Types: Marijuana    Comment: Once or twice a week   Sexual activity: Yes

## 2022-06-17 ENCOUNTER — Ambulatory Visit (INDEPENDENT_AMBULATORY_CARE_PROVIDER_SITE_OTHER): Payer: No Typology Code available for payment source | Admitting: Orthopaedic Surgery

## 2022-06-17 ENCOUNTER — Encounter: Payer: Self-pay | Admitting: Orthopaedic Surgery

## 2022-06-17 DIAGNOSIS — Z96641 Presence of right artificial hip joint: Secondary | ICD-10-CM

## 2022-06-17 NOTE — Progress Notes (Signed)
   Post-Op Visit Note   Patient: Antonio Baldwin.           Date of Birth: May 25, 1956           MRN: [id number] Visit Date: 06/17/2022 PCP: Etta Grandchild, MD   Assessment & Plan:  Chief Complaint:  Chief Complaint  Patient presents with   Right Hip - Follow-up    Right total hip arthroplasty 03/24/2022   Visit Diagnoses:  1. Status post total replacement of right hip     Plan: [name] is a 29-month status post right total hip on 03/24/2022.  Has occasional pain with hip flexion but otherwise no complaints.  Examination of right hip shows a fully healed surgical scar.  Excellent passive range of motion without pain.  Normal gait and ambulation.  [name] has done very well from surgery and very pleased with the recovery and outcome.  Dental prophylaxis reinforced.  Recheck in 3 months with standing AP pelvis x-rays.  Follow-Up Instructions: Return in about 3 months (around 09/17/2022).   Orders:  No orders of the defined types were placed in this encounter.  No orders of the defined types were placed in this encounter.   Imaging: No results found.  PMFS History: Patient Active Problem List   Diagnosis Date Noted   Status post total replacement of right hip 03/24/2022   Diabetes 1.5, managed as type 2 (HCC) 06/23/2021   Low TSH level 06/23/2021   Hyperlipidemia with target LDL less than 100 06/20/2021   Primary hypertension 06/20/2021   Type II diabetes mellitus with manifestations (HCC) 06/20/2021   Prostate cancer screening 06/20/2021   Primary osteoarthritis of right hip 05/23/2020   Essential hypertension, benign 04/15/2013   Hyperlipidemia LDL goal < 100 04/15/2013   Type 1 diabetes mellitus on insulin therapy (HCC) 09/15/2012   ED (erectile dysfunction) 09/15/2012   Past Medical History:  Diagnosis Date   Allergy    Arthritis    Asthma    Diabetes mellitus without complication (HCC)    GERD (gastroesophageal reflux disease)    HTN (hypertension)      Family History  Problem Relation Age of Onset   Arthritis Other    Hypertension Other    Diabetes Other    Arthritis Mother    Hypertension Mother    Diabetes Mother    Heart disease Neg Hx    Early death Neg Hx    Stroke Neg Hx     Past Surgical History:  Procedure Laterality Date   TONSILLECTOMY AND ADENOIDECTOMY  11/24/1958   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   Procedure: RIGHT TOTAL HIP REPLACEMENT;  Surgeon: Tarry Kos, MD;  Location: [facility];  Service: Orthopedics;  Laterality: Right;   WISDOM TOOTH EXTRACTION     Social History   Occupational History   Not on file  Tobacco Use   Smoking status: Former    Types: Cigarettes    Quit date: 09/15/2002    Years since quitting: 19.7   Smokeless tobacco: Never  Vaping Use   Vaping Use: Never used  Substance and Sexual Activity   Alcohol use: No   Drug use: Yes    Types: Marijuana    Comment: Once or twice a week   Sexual activity: Yes

## 2022-07-09 ENCOUNTER — Ambulatory Visit: Payer: No Typology Code available for payment source | Admitting: Internal Medicine

## 2022-08-22 ENCOUNTER — Ambulatory Visit: Payer: No Typology Code available for payment source

## 2022-08-25 IMAGING — RF DG HIP (WITH OR WITHOUT PELVIS) 1V*R*
1 series · 7 of 7 positions shown · non-contrast
Comparison: Pelvis and right hip radiographs 01/09/2022

CLINICAL DATA: Fluoroscopy for total right hip arthroplasty.

EXAM:
DG HIP (WITH OR WITHOUT PELVIS) 1V RIGHT

[Series 1: dg x-ray · 0.20mm/px · 7 of 7 slices shown]
[im 1/7]
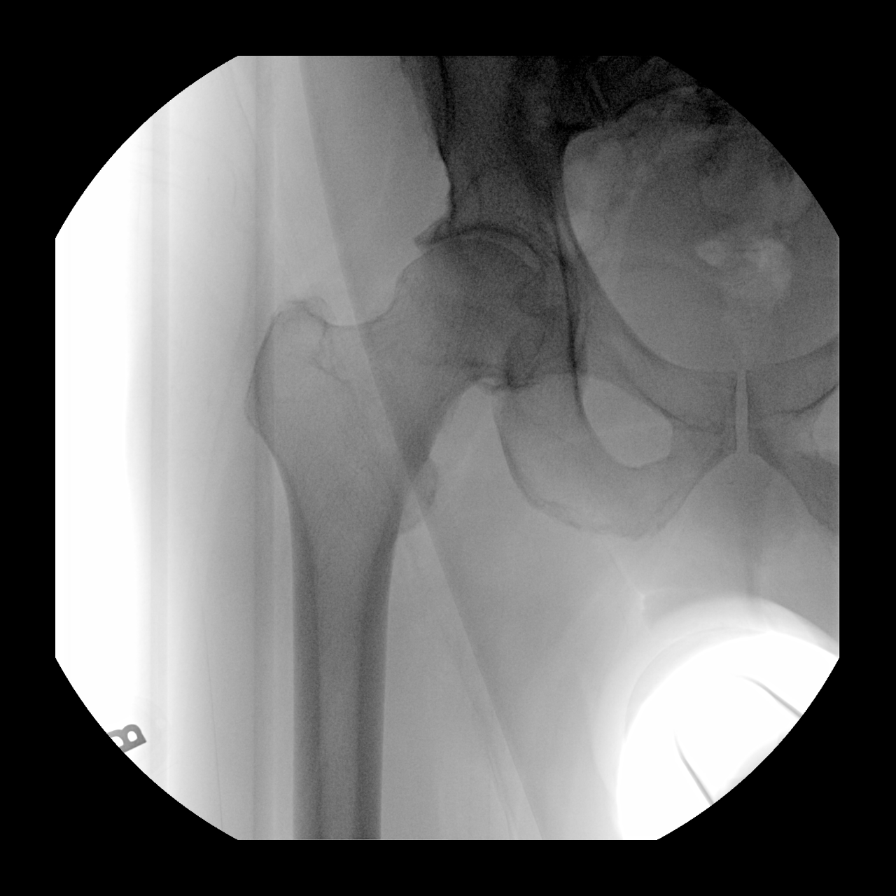
[im 2/7]
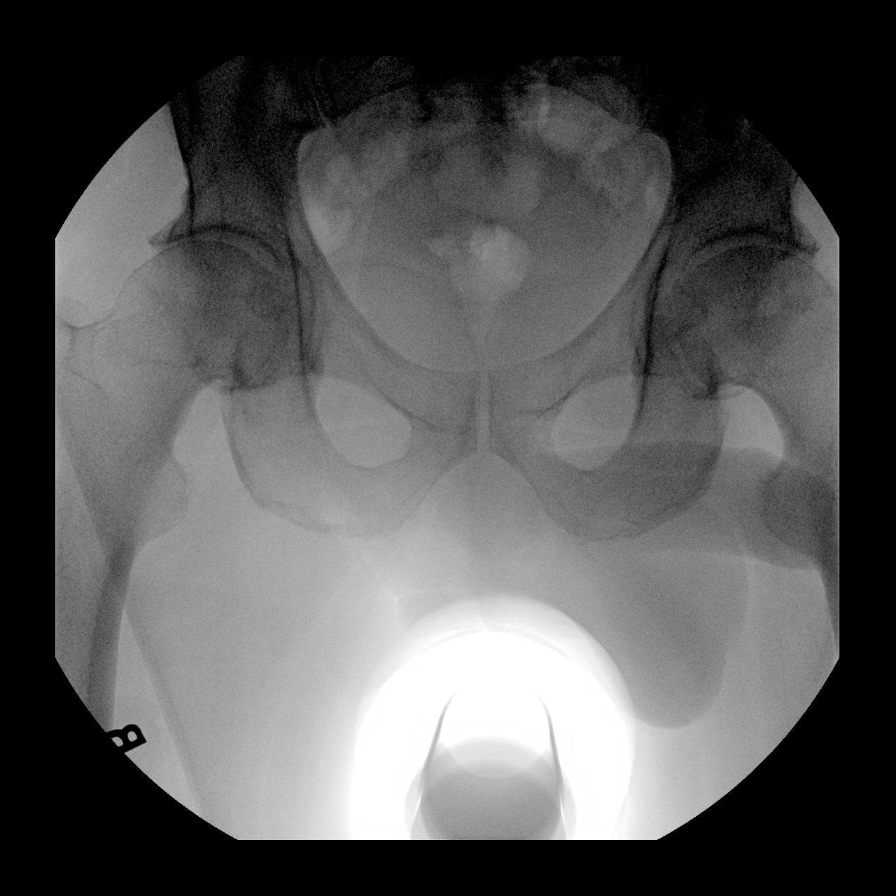
[im 3/7]
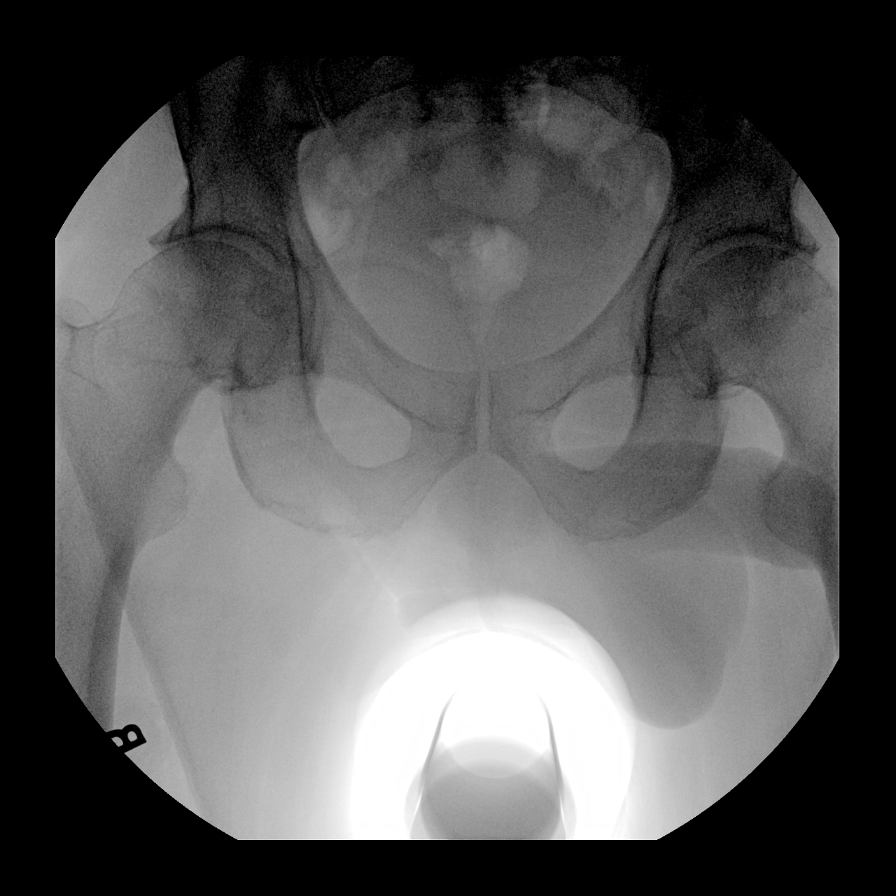
[im 4/7]
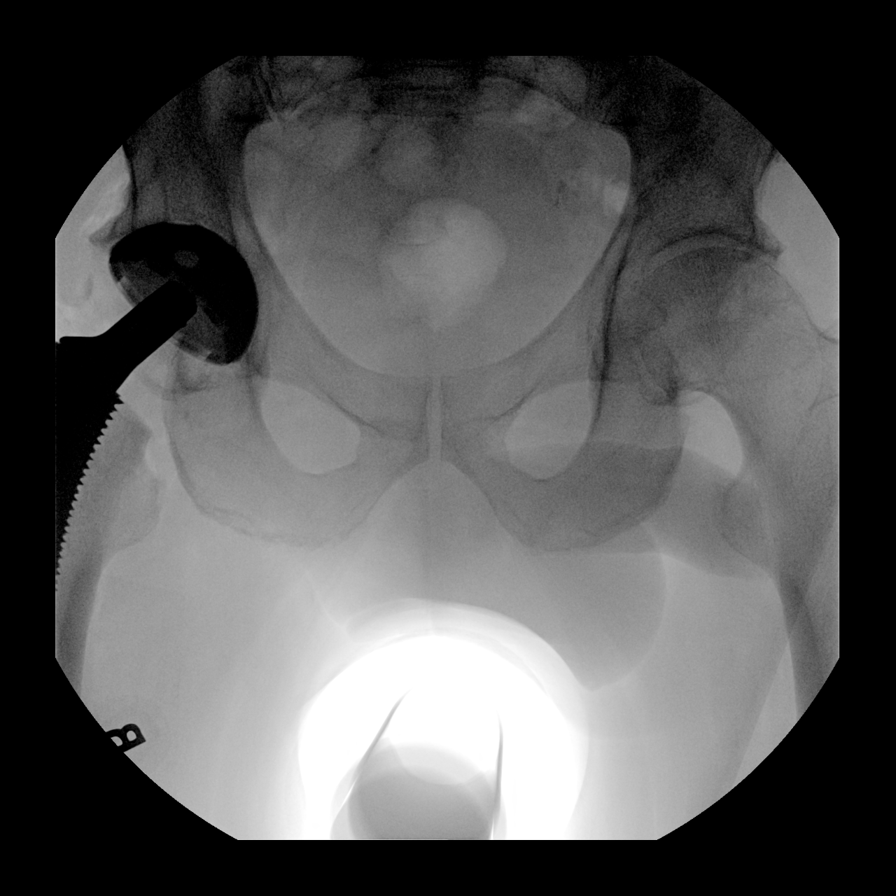
[im 5/7]
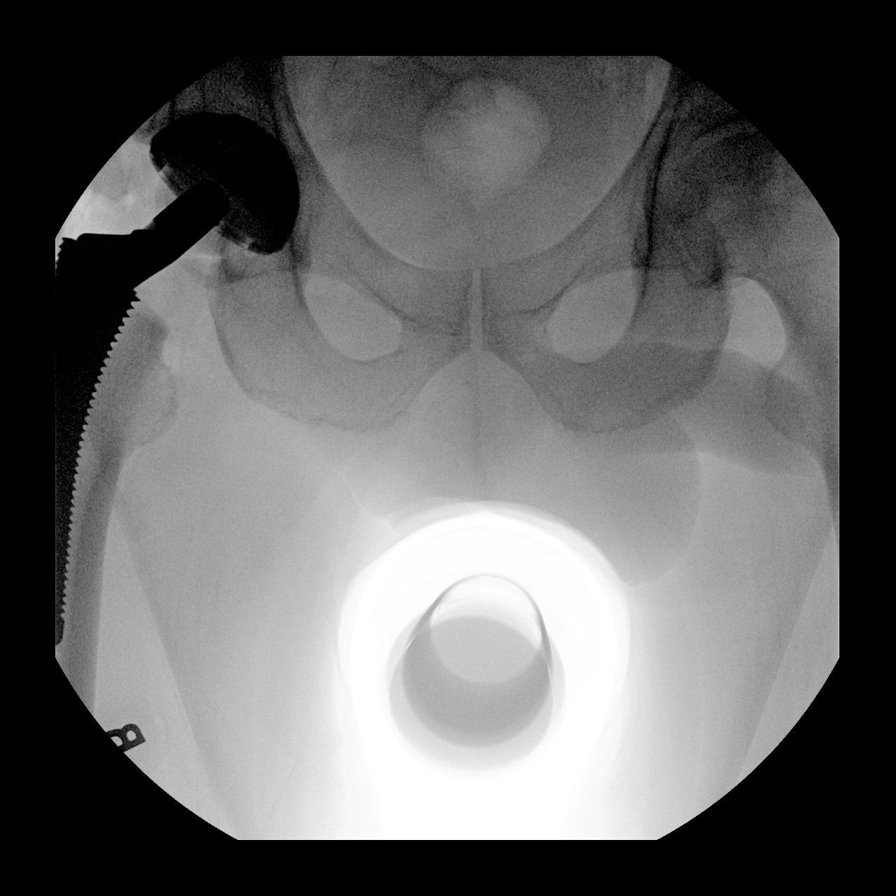
[im 6/7]
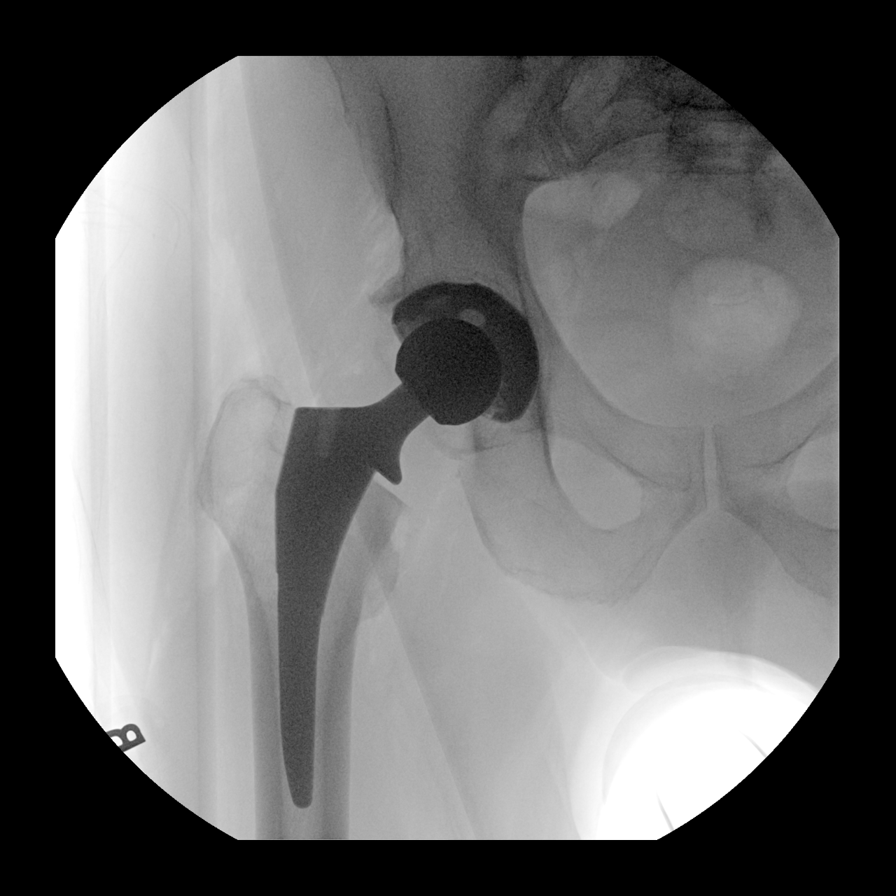
[im 7/7]
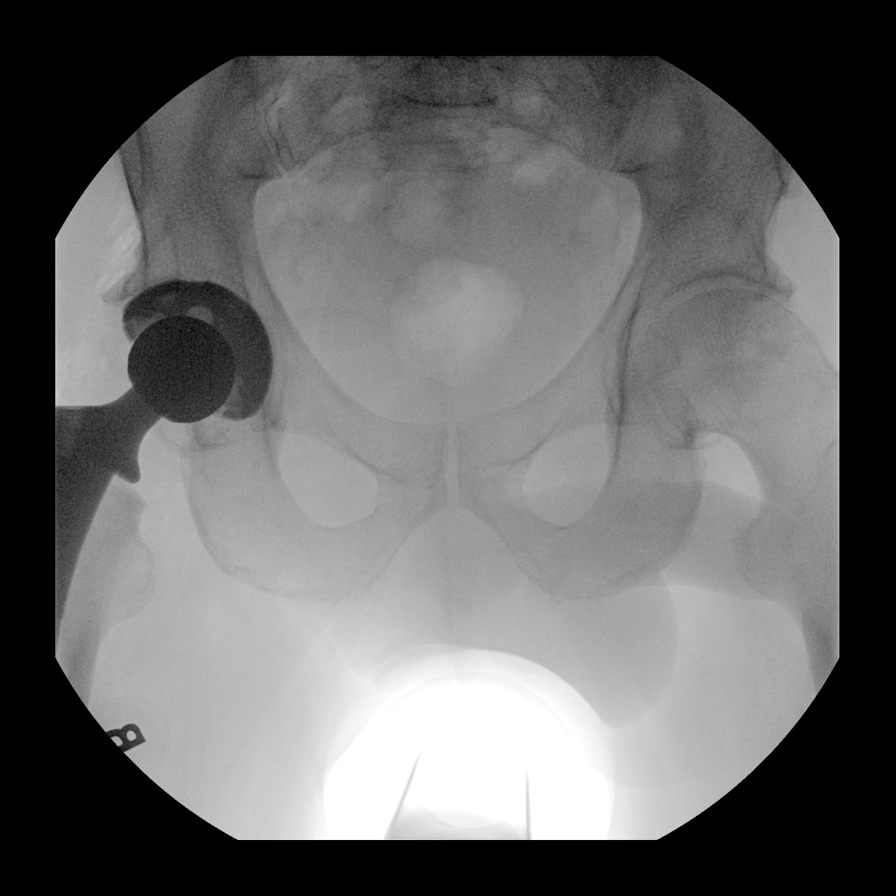

[7 of 7 positions shown; findings below may reference images not displayed]

FINDINGS: Images were performed intraoperatively without the presence of a
radiologist. The patient is undergoing total right hip arthroplasty.

Total fluoroscopy images: 6

Total fluoroscopy time: 21 seconds

Total dose: Radiation Exposure Index (as provided by the
fluoroscopic device): 3.14 mGy air Kerma

Please see intraoperative findings for further detail.
IMPRESSION: Intraoperative fluoroscopy for total right hip arthroplasty.

## 2022-10-03 ENCOUNTER — Ambulatory Visit: Payer: No Typology Code available for payment source | Admitting: Family Medicine

## 2022-11-24 ENCOUNTER — Inpatient Hospital Stay (HOSPITAL_COMMUNITY): Payer: No Typology Code available for payment source

## 2022-11-24 ENCOUNTER — Encounter (HOSPITAL_COMMUNITY): Payer: Self-pay | Admitting: Pharmacy Technician

## 2022-11-24 ENCOUNTER — Inpatient Hospital Stay (HOSPITAL_COMMUNITY)
Admission: EM | Admit: 2022-11-24 | Discharge: 2022-11-25 | DRG: 074 | Disposition: A | Discharge status: Home / Self Care | Payer: No Typology Code available for payment source | Attending: Family Medicine | Admitting: Family Medicine

## 2022-11-24 ENCOUNTER — Other Ambulatory Visit: Payer: Self-pay

## 2022-11-24 DIAGNOSIS — K219 Gastro-esophageal reflux disease without esophagitis: Secondary | ICD-10-CM | POA: Diagnosis present

## 2022-11-24 DIAGNOSIS — Z79899 Other long term (current) drug therapy: Secondary | ICD-10-CM | POA: Diagnosis not present

## 2022-11-24 DIAGNOSIS — I10 Essential (primary) hypertension: Secondary | ICD-10-CM | POA: Diagnosis present

## 2022-11-24 DIAGNOSIS — N179 Acute kidney failure, unspecified: Secondary | ICD-10-CM | POA: Insufficient documentation

## 2022-11-24 DIAGNOSIS — E873 Alkalosis: Secondary | ICD-10-CM | POA: Diagnosis present

## 2022-11-24 DIAGNOSIS — M1990 Unspecified osteoarthritis, unspecified site: Secondary | ICD-10-CM | POA: Diagnosis present

## 2022-11-24 DIAGNOSIS — Z7982 Long term (current) use of aspirin: Secondary | ICD-10-CM | POA: Diagnosis not present

## 2022-11-24 DIAGNOSIS — E1110 Type 2 diabetes mellitus with ketoacidosis without coma: Secondary | ICD-10-CM | POA: Insufficient documentation

## 2022-11-24 DIAGNOSIS — Z87891 Personal history of nicotine dependence: Secondary | ICD-10-CM | POA: Diagnosis not present

## 2022-11-24 DIAGNOSIS — R1110 Vomiting, unspecified: Secondary | ICD-10-CM | POA: Diagnosis present

## 2022-11-24 DIAGNOSIS — E118 Type 2 diabetes mellitus with unspecified complications: Secondary | ICD-10-CM | POA: Diagnosis present

## 2022-11-24 DIAGNOSIS — J4520 Mild intermittent asthma, uncomplicated: Secondary | ICD-10-CM | POA: Diagnosis present

## 2022-11-24 DIAGNOSIS — Z833 Family history of diabetes mellitus: Secondary | ICD-10-CM

## 2022-11-24 DIAGNOSIS — Z7985 Long-term (current) use of injectable non-insulin antidiabetic drugs: Secondary | ICD-10-CM

## 2022-11-24 DIAGNOSIS — Z8249 Family history of ischemic heart disease and other diseases of the circulatory system: Secondary | ICD-10-CM

## 2022-11-24 DIAGNOSIS — E1143 Type 2 diabetes mellitus with diabetic autonomic (poly)neuropathy: Secondary | ICD-10-CM | POA: Diagnosis present

## 2022-11-24 DIAGNOSIS — Z794 Long term (current) use of insulin: Secondary | ICD-10-CM

## 2022-11-24 DIAGNOSIS — Z8261 Family history of arthritis: Secondary | ICD-10-CM

## 2022-11-24 DIAGNOSIS — Z96643 Presence of artificial hip joint, bilateral: Secondary | ICD-10-CM | POA: Diagnosis present

## 2022-11-24 DIAGNOSIS — Z7984 Long term (current) use of oral hypoglycemic drugs: Secondary | ICD-10-CM

## 2022-11-24 DIAGNOSIS — K3184 Gastroparesis: Secondary | ICD-10-CM | POA: Diagnosis present

## 2022-11-24 DIAGNOSIS — E119 Type 2 diabetes mellitus without complications: Secondary | ICD-10-CM | POA: Diagnosis present

## 2022-11-24 DIAGNOSIS — E860 Dehydration: Secondary | ICD-10-CM

## 2022-11-24 LAB — I-STAT VENOUS BLOOD GAS, ED
Acid-base deficit: 9 mmol/L — ABNORMAL HIGH (ref 0.0–2.0)
Bicarbonate: 15.4 mmol/L — ABNORMAL LOW (ref 20.0–28.0)
Calcium, Ion: 1.17 mmol/L (ref 1.15–1.40)
HCT: 45 % (ref 39.0–52.0)
Hemoglobin: 15.3 g/dL (ref 13.0–17.0)
O2 Saturation: 84 %
Potassium: 4.1 mmol/L (ref 3.5–5.1)
Sodium: 137 mmol/L (ref 135–145)
TCO2: 16 mmol/L — ABNORMAL LOW (ref 22–32)
pCO2, Ven: 29.8 mmHg — ABNORMAL LOW (ref 44–60)
pH, Ven: 7.321 (ref 7.25–7.43)
pO2, Ven: 51 mmHg — ABNORMAL HIGH (ref 32–45)

## 2022-11-24 LAB — CBC
HCT: 46.3 % (ref 39.0–52.0)
Hemoglobin: 15.2 g/dL (ref 13.0–17.0)
MCH: 29.5 pg (ref 26.0–34.0)
MCHC: 32.8 g/dL (ref 30.0–36.0)
MCV: 89.9 fL (ref 80.0–100.0)
Platelets: 281 10*3/uL (ref 150–400)
RBC: 5.15 MIL/uL (ref 4.22–5.81)
RDW: 12.9 % (ref 11.5–15.5)
WBC: 12.9 10*3/uL — ABNORMAL HIGH (ref 4.0–10.5)
nRBC: 0 % (ref 0.0–0.2)

## 2022-11-24 LAB — COMPREHENSIVE METABOLIC PANEL
ALT: 18 U/L (ref 0–44)
AST: 17 U/L (ref 15–41)
Albumin: 4.5 g/dL (ref 3.5–5.0)
Alkaline Phosphatase: 69 U/L (ref 38–126)
Anion gap: 19 — ABNORMAL HIGH (ref 5–15)
BUN: 32 mg/dL — ABNORMAL HIGH (ref 8–23)
CO2: 16 mmol/L — ABNORMAL LOW (ref 22–32)
Calcium: 9.5 mg/dL (ref 8.9–10.3)
Chloride: 100 mmol/L (ref 98–111)
Creatinine, Ser: 1.52 mg/dL — ABNORMAL HIGH (ref 0.61–1.24)
GFR, Estimated: 50 mL/min — ABNORMAL LOW (ref >60)
Glucose, Bld: 304 mg/dL — ABNORMAL HIGH (ref 70–99)
Potassium: 4 mmol/L (ref 3.5–5.1)
Sodium: 135 mmol/L (ref 135–145)
Total Bilirubin: 1.3 mg/dL — ABNORMAL HIGH (ref 0.3–1.2)
Total Protein: 8.6 g/dL — ABNORMAL HIGH (ref 6.5–8.1)

## 2022-11-24 LAB — URINALYSIS, ROUTINE W REFLEX MICROSCOPIC
Bacteria, UA: NONE SEEN
Bilirubin Urine: NEGATIVE
Glucose, UA: >=500 mg/dL — AB
Hgb urine dipstick: NEGATIVE
Ketones, ur: 80 mg/dL — AB
Leukocytes,Ua: NEGATIVE
Nitrite: NEGATIVE
Protein, ur: 100 mg/dL — AB
Specific Gravity, Urine: 1.026 (ref 1.005–1.030)
pH: 5 (ref 5.0–8.0)

## 2022-11-24 LAB — BASIC METABOLIC PANEL
Anion gap: 13 (ref 5–15)
BUN: 23 mg/dL (ref 8–23)
CO2: 19 mmol/L — ABNORMAL LOW (ref 22–32)
Calcium: 8.5 mg/dL — ABNORMAL LOW (ref 8.9–10.3)
Chloride: 105 mmol/L (ref 98–111)
Creatinine, Ser: 1 mg/dL (ref 0.61–1.24)
GFR, Estimated: >60 mL/min (ref >60)
Glucose, Bld: 105 mg/dL — ABNORMAL HIGH (ref 70–99)
Potassium: 3.6 mmol/L (ref 3.5–5.1)
Sodium: 137 mmol/L (ref 135–145)

## 2022-11-24 LAB — CBG MONITORING, ED
Glucose-Capillary: 286 mg/dL — ABNORMAL HIGH (ref 70–99)
Glucose-Capillary: 248 mg/dL — ABNORMAL HIGH (ref 70–99)
Glucose-Capillary: 280 mg/dL — ABNORMAL HIGH (ref 70–99)
Glucose-Capillary: 165 mg/dL — ABNORMAL HIGH (ref 70–99)

## 2022-11-24 LAB — HIV ANTIBODY (ROUTINE TESTING W REFLEX): HIV Screen 4th Generation wRfx: NONREACTIVE

## 2022-11-24 LAB — LIPASE, BLOOD: Lipase: 30 U/L (ref 11–51)

## 2022-11-24 LAB — GLUCOSE, CAPILLARY
Glucose-Capillary: 100 mg/dL — ABNORMAL HIGH (ref 70–99)
Glucose-Capillary: 111 mg/dL — ABNORMAL HIGH (ref 70–99)

## 2022-11-24 MED ORDER — METOCLOPRAMIDE HCL 5 MG/ML IJ SOLN
10.0000 mg | Freq: Four times a day (QID) | INTRAMUSCULAR | Status: DC
  Administered 2022-11-24 – 2022-11-25 (×3): 10 mg via INTRAVENOUS
  Filled 2022-11-24 (×3): qty 2

## 2022-11-24 MED ORDER — LACTATED RINGERS IV SOLN: INTRAVENOUS | Status: DC

## 2022-11-24 MED ORDER — INSULIN GLARGINE-YFGN 100 UNIT/ML ~~LOC~~ SOLN
30.0000 [IU] | Freq: Every day | SUBCUTANEOUS | Status: DC
  Administered 2022-11-24: 30 [IU] via SUBCUTANEOUS
  Filled 2022-11-24 (×3): qty 0.3

## 2022-11-24 MED ORDER — METOCLOPRAMIDE HCL 5 MG/ML IJ SOLN
10.0000 mg | Freq: Once | INTRAMUSCULAR | Status: AC
  Administered 2022-11-24: 10 mg via INTRAVENOUS
  Filled 2022-11-24: qty 2

## 2022-11-24 MED ORDER — DOCUSATE SODIUM 100 MG PO CAPS: 100.0000 mg | ORAL_CAPSULE | Freq: Every day | ORAL | Status: DC | PRN

## 2022-11-24 MED ORDER — INSULIN ASPART 100 UNIT/ML IJ SOLN: 0.0000 [IU] | Freq: Three times a day (TID) | INTRAMUSCULAR | Status: DC

## 2022-11-24 MED ORDER — METHOCARBAMOL 500 MG PO TABS: 500.0000 mg | ORAL_TABLET | Freq: Two times a day (BID) | ORAL | Status: DC | PRN

## 2022-11-24 MED ORDER — ROSUVASTATIN CALCIUM 20 MG PO TABS
20.0000 mg | ORAL_TABLET | Freq: Every day | ORAL | Status: DC
  Administered 2022-11-24: 20 mg via ORAL
  Filled 2022-11-24 (×2): qty 1

## 2022-11-24 MED ORDER — DEXTROSE 50 % IV SOLN: 0.0000 mL | INTRAVENOUS | Status: DC | PRN

## 2022-11-24 MED ORDER — LACTATED RINGERS IV BOLUS
1000.0000 mL | Freq: Once | INTRAVENOUS | Status: AC
  Administered 2022-11-24: 1000 mL via INTRAVENOUS

## 2022-11-24 MED ORDER — PANTOPRAZOLE SODIUM 40 MG PO TBEC
40.0000 mg | DELAYED_RELEASE_TABLET | Freq: Every day | ORAL | Status: DC
  Administered 2022-11-24 – 2022-11-25 (×2): 40 mg via ORAL
  Filled 2022-11-24 (×2): qty 1

## 2022-11-24 MED ORDER — DEXTROSE IN LACTATED RINGERS 5 % IV SOLN: INTRAVENOUS | Status: DC

## 2022-11-24 MED ORDER — ENOXAPARIN SODIUM 40 MG/0.4ML IJ SOSY
40.0000 mg | PREFILLED_SYRINGE | INTRAMUSCULAR | Status: DC
  Administered 2022-11-24: 40 mg via SUBCUTANEOUS
  Filled 2022-11-24: qty 0.4

## 2022-11-24 MED ORDER — ONDANSETRON HCL 4 MG/2ML IJ SOLN
4.0000 mg | Freq: Once | INTRAMUSCULAR | Status: AC
  Administered 2022-11-24: 4 mg via INTRAVENOUS
  Filled 2022-11-24: qty 2

## 2022-11-24 MED ORDER — MONTELUKAST SODIUM 10 MG PO TABS
10.0000 mg | ORAL_TABLET | Freq: Every day | ORAL | Status: DC
  Administered 2022-11-24 – 2022-11-25 (×2): 10 mg via ORAL
  Filled 2022-11-24 (×2): qty 1

## 2022-11-24 MED ORDER — HYDRALAZINE HCL 25 MG PO TABS: 25.0000 mg | ORAL_TABLET | Freq: Four times a day (QID) | ORAL | Status: DC | PRN

## 2022-11-24 MED ORDER — POTASSIUM CHLORIDE 10 MEQ/100ML IV SOLN
10.0000 meq | INTRAVENOUS | Status: AC
  Administered 2022-11-24 (×2): 10 meq via INTRAVENOUS
  Filled 2022-11-24 (×2): qty 100

## 2022-11-24 MED ORDER — INSULIN ASPART 100 UNIT/ML IJ SOLN: 0.0000 [IU] | Freq: Every day | INTRAMUSCULAR | Status: DC

## 2022-11-24 MED ORDER — LACTATED RINGERS IV BOLUS: 20.0000 mL/kg | Freq: Once | INTRAVENOUS | Status: DC

## 2022-11-24 MED ORDER — SODIUM CHLORIDE 0.9 % IV BOLUS
1000.0000 mL | INTRAVENOUS | Status: DC
  Administered 2022-11-24 (×2): 1000 mL via INTRAVENOUS

## 2022-11-24 MED ORDER — INSULIN REGULAR(HUMAN) IN NACL 100-0.9 UT/100ML-% IV SOLN
INTRAVENOUS | Status: DC
  Administered 2022-11-24: 10 [IU]/h via INTRAVENOUS
  Filled 2022-11-24: qty 100

## 2022-11-24 MED ORDER — ONDANSETRON 4 MG PO TBDP
4.0000 mg | ORAL_TABLET | Freq: Once | ORAL | Status: AC | PRN
  Administered 2022-11-24: 4 mg via ORAL
  Filled 2022-11-24: qty 1

## 2022-11-24 MED ORDER — INSULIN GLARGINE-YFGN 100 UNIT/ML ~~LOC~~ SOLN: 30.0000 [IU] | Freq: Every day | SUBCUTANEOUS | Status: DC

## 2022-11-24 MED ORDER — PNEUMOCOCCAL 20-VAL CONJ VACC 0.5 ML IM SUSY: 0.5000 mL | PREFILLED_SYRINGE | INTRAMUSCULAR | Status: DC | PRN

## 2022-11-24 MED ORDER — ALBUTEROL SULFATE (2.5 MG/3ML) 0.083% IN NEBU: 2.5000 mg | INHALATION_SOLUTION | RESPIRATORY_TRACT | Status: DC | PRN

## 2022-11-24 MED ORDER — SODIUM CHLORIDE 0.9 % IV SOLN: INTRAVENOUS | Status: DC

## 2022-11-24 MED ORDER — ONDANSETRON HCL 4 MG/2ML IJ SOLN
4.0000 mg | Freq: Four times a day (QID) | INTRAMUSCULAR | Status: DC | PRN
  Filled 2022-11-24: qty 2

## 2022-11-24 MED ORDER — ASPIRIN 81 MG PO TBEC
81.0000 mg | DELAYED_RELEASE_TABLET | Freq: Two times a day (BID) | ORAL | Status: DC
  Administered 2022-11-24 – 2022-11-25 (×2): 81 mg via ORAL
  Filled 2022-11-24 (×2): qty 1

## 2022-11-24 NOTE — ED Triage Notes (Signed)
Pt here with reports of nausea and vomiting for the last 3 days. Reports unable to tolerate PO intake. Family states blood sugar elevated in the 400's. Pt complains of diffuse abdominal pain.

## 2022-11-24 NOTE — ED Notes (Signed)
Patient transported to X-ray 

## 2022-11-24 NOTE — ED Provider Notes (Addendum)
[facility] EMERGENCY DEPARTMENT Provider Note   CSN: [id number] Arrival date & time: 11/24/22  6440     History  Chief Complaint  Patient presents with   Emesis    Antonio Baldwin. is a 67 y.o. male.  Patient is a 67 year old male with a history of asthma, diabetes, hypertension and GERD who is presenting today with complaints of nausea vomiting and diarrhea for the last 3 days.  Patient is reporting intermittent abdominal pain as well in the epigastric area related to all the vomiting and diarrhea.  He has not had a fever denies any recent antibiotic use.  Reports this has occurred in the past as he has a history of gastroparesis but he is not even able to keep down his nausea medication.  His sugars have been running higher than normal and they have continued to use his insulin at home and blood sugar this morning was 300.  He denies any cough, congestion, shortness of breath.  He still is just feeling very nauseated.  No prior abdominal surgeries.  The history is provided by the patient and a relative.  Emesis      Home Medications Prior to Admission medications   Medication Sig Start Date End Date Taking? Authorizing Provider  albuterol (VENTOLIN HFA) 108 (90 Base) MCG/ACT inhaler Inhale 1-2 puffs into the lungs every 4 (four) hours as needed for wheezing or shortness of breath.    [provider]  amLODipine (NORVASC) 10 MG tablet Take 10 mg by mouth daily.    [provider]  aspirin EC 81 MG tablet Take 1 tablet (81 mg total) by mouth 2 (two) times daily. To be taken after surgery to prevent blood clots 03/17/22   Cristie Hem, PA-C  docusate sodium (COLACE) 100 MG capsule Take 1 capsule (100 mg total) by mouth daily as needed. 03/17/22 03/17/23  Cristie Hem, PA-C  empagliflozin (JARDIANCE) 25 MG TABS tablet Take 25 mg by mouth daily.    [provider]  insulin glargine (LANTUS) 100 UNIT/ML injection Inject 0.5 mLs (50  Units total) into the skin daily. Patient taking differently: Inject 30 Units into the skin daily. 04/15/13   Etta Grandchild, MD  metFORMIN (GLUCOPHAGE) 500 MG tablet Take 500 mg by mouth 2 (two) times daily with a meal.    [provider]  methocarbamol (ROBAXIN) 500 MG tablet Take 1 tablet (500 mg total) by mouth 2 (two) times daily as needed. To be taken after surgery 03/17/22   Cristie Hem, PA-C  montelukast (SINGULAIR) 10 MG tablet Take 10 mg by mouth daily.    [provider]  omeprazole (PRILOSEC) 20 MG capsule Take 20 mg by mouth daily.    [provider]  ondansetron (ZOFRAN) 4 MG tablet Take 1 tablet (4 mg total) by mouth every 8 (eight) hours as needed for nausea or vomiting. 03/17/22   Cristie Hem, PA-C  oxyCODONE-acetaminophen (PERCOCET) 5-325 MG tablet Take 1-2 tablets by mouth every 6 (six) hours as needed. To be taken after surgery 03/17/22   Cristie Hem, PA-C  rosuvastatin (CRESTOR) 20 MG tablet Take 1 tablet (20 mg total) by mouth daily. 06/21/21   Etta Grandchild, MD  Semaglutide,0.25 or 0.5MG /DOS, (OZEMPIC, 0.25 OR 0.5 MG/DOSE,) 2 MG/1.5ML SOPN Inject 0.5 mg into the skin every Sunday.    [provider]  sulfamethoxazole-trimethoprim (BACTRIM DS) 800-160 MG tablet Take 1 tablet by mouth 2 (two) times daily. To  be taken after surgery 03/17/22   Aundra Dubin, PA-C      Allergies    Patient has no known allergies.    Review of Systems   Review of Systems  Gastrointestinal:  Positive for vomiting.    Physical Exam Updated Vital Signs BP (!) 155/65 (BP Location: Right Arm)   Pulse (!) 104   Temp 97.7 F (36.5 C) (Oral)   Resp 19   SpO2 100%  Physical Exam Vitals and nursing note reviewed.  Constitutional:      General: He is not in acute distress.    Appearance: He is well-developed.  HENT:     Head: Normocephalic and atraumatic.     Mouth/Throat:     Mouth: Mucous membranes are dry.  Eyes:      Conjunctiva/sclera: Conjunctivae normal.     Pupils: Pupils are equal, round, and reactive to light.  Cardiovascular:     Rate and Rhythm: Regular rhythm. Tachycardia present.     Heart sounds: No murmur heard. Pulmonary:     Effort: Pulmonary effort is normal. No respiratory distress.     Breath sounds: Normal breath sounds. No wheezing or rales.  Abdominal:     General: There is no distension.     Palpations: Abdomen is soft.     Tenderness: There is abdominal tenderness in the epigastric area and periumbilical area. There is no guarding or rebound.  Musculoskeletal:        General: No tenderness. Normal range of motion.     Cervical back: Normal range of motion and neck supple.  Skin:    General: Skin is warm and dry.     Findings: No erythema or rash.  Neurological:     Mental Status: He is alert and oriented to person, place, and time.  Psychiatric:        Mood and Affect: Mood normal.        Behavior: Behavior normal.     ED Results / Procedures / Treatments   Labs (all labs ordered are listed, but only abnormal results are displayed) Labs Reviewed  COMPREHENSIVE METABOLIC PANEL - Abnormal; Notable for the following components:      Result Value   CO2 16 (*)    Glucose, Bld 304 (*)    BUN 32 (*)    Creatinine, Ser 1.52 (*)    Total Protein 8.6 (*)    Total Bilirubin 1.3 (*)    GFR, Estimated 50 (*)    Anion gap 19 (*)    All other components within normal limits  CBC - Abnormal; Notable for the following components:   WBC 12.9 (*)    All other components within normal limits  URINALYSIS, ROUTINE W REFLEX MICROSCOPIC - Abnormal; Notable for the following components:   Glucose, UA >=500 (*)    Ketones, ur 80 (*)    Protein, ur 100 (*)    All other components within normal limits  CBG MONITORING, ED - Abnormal; Notable for the following components:   Glucose-Capillary 286 (*)    All other components within normal limits  I-STAT VENOUS BLOOD GAS, ED - Abnormal;  Notable for the following components:   pCO2, Ven 29.8 (*)    pO2, Ven 51 (*)    Bicarbonate 15.4 (*)    TCO2 16 (*)    Acid-base deficit 9.0 (*)    All other components within normal limits  CBG MONITORING, ED - Abnormal; Notable for the following components:   Glucose-Capillary 248 (*)  All other components within normal limits  LIPASE, BLOOD  BETA-HYDROXYBUTYRIC ACID  BETA-HYDROXYBUTYRIC ACID  BASIC METABOLIC PANEL  BASIC METABOLIC PANEL  BASIC METABOLIC PANEL    EKG None  Radiology No results found.  Procedures Procedures    Medications Ordered in ED Medications  lactated ringers infusion (0 mLs Intravenous Stopped 11/24/22 1250)  ondansetron (ZOFRAN) injection 4 mg (has no administration in time range)  insulin regular, human (MYXREDLIN) 100 units/ 100 mL infusion (has no administration in time range)  dextrose 5 % in lactated ringers infusion (has no administration in time range)  dextrose 50 % solution 0-50 mL (has no administration in time range)  potassium chloride 10 mEq in 100 mL IVPB (has no administration in time range)  ondansetron (ZOFRAN-ODT) disintegrating tablet 4 mg (4 mg Oral Given 11/24/22 0842)  lactated ringers bolus 1,000 mL (0 mLs Intravenous Stopped 11/24/22 1249)  metoCLOPramide (REGLAN) injection 10 mg (10 mg Intravenous Given 11/24/22 1143)  lactated ringers bolus 1,000 mL (0 mLs Intravenous Stopped 11/24/22 1339)    ED Course/ Medical Decision Making/ A&P                           Medical Decision Making Amount and/or Complexity of Data Reviewed Independent Historian: spouse External Data Reviewed: notes. Labs: ordered. Decision-making details documented in ED Course.  Risk Prescription drug management. Decision regarding hospitalization.  Pt with multiple medical problems and comorbidities and presenting today with a complaint that caries a high risk for morbidity and mortality.  Here today with complaint of nausea vomiting and diarrhea.   Patient is tachycardic and appears dehydrated.  Concern for gastroparesis, gastroenteritis versus foodborne pathology versus pancreatitis, hepatitis, DKA.  I independently interpreted patient's labs which show a CBC with a mild leukocytosis of 12.9 with stable hemoglobin, CMP with new AKI today with creatinine of 1.52 from his baseline of 0.7, normal LFTs and anion gap of 19, lipase is within normal limits and UA with ketones.  Patient's blood sugar is 286.  Will give IV hydration and antiemetics.  VBG is pending.  Patient has some mild epigastric discomfort but no frank abdominal pain concerning for perforation.  2:03 PM Patient's VBG shows metabolic acidosis with respiratory compensation with a bicarb of 15 and a CO2 of 29.  After 2 L of IV fluids patient is still having nausea and concern for mild dka.  He was only able to take in a few sips of sprite and became very nauseated.  Repeat blood sugar is 248.  Will start insulin gtt and admit for further care.   CRITICAL CARE Performed by: Uchechukwu Dhawan Total critical care time: 30 minutes Critical care time was exclusive of separately billable procedures and treating other patients. Critical care was necessary to treat or prevent imminent or life-threatening deterioration. Critical care was time spent personally by me on the following activities: development of treatment plan with patient and/or surrogate as well as nursing, discussions with consultants, evaluation of patient's response to treatment, examination of patient, obtaining history from patient or surrogate, ordering and performing treatments and interventions, ordering and review of laboratory studies, ordering and review of radiographic studies, pulse oximetry and re-evaluation of patient's condition.        Final Clinical Impression(s) / ED Diagnoses Final diagnoses:  Diabetic ketoacidosis without coma associated with type 2 diabetes mellitus ([place])  Dehydration  AKI (acute  kidney injury) ([place])    Rx / DC Orders ED Discharge  Orders     None         Gwyneth Sprout, MD 11/24/22 6222    Gwyneth Sprout, MD 11/24/22 [phone]

## 2022-11-24 NOTE — ED Notes (Signed)
Verbal order to stop the insulin drip. From Roosevelt Locks, MD without Long acting subq insulin given.

## 2022-11-24 NOTE — ED Notes (Signed)
ED TO INPATIENT HANDOFF REPORT  ED Nurse Name and Phone #: [phone]  S Name/Age/Gender Antonio Baldwin. 67 y.o. male Room/Bed: 014C/014C  Code Status   Code Status: Full Code  Home/SNF/Other Home Patient oriented to: self, place, time, and situation Is this baseline? Yes   Triage Complete: Triage complete  Chief Complaint DKA (diabetic ketoacidosis) ([place]) [E11.10]  Triage Note Pt here with reports of nausea and vomiting for the last 3 days. Reports unable to tolerate PO intake. Family states blood sugar elevated in the 400's. Pt complains of diffuse abdominal pain.    Allergies No Known Allergies  Level of Care/Admitting Diagnosis ED Disposition     ED Disposition  Admit   Condition  --   Comment  Hospital Area: [facility] [[id number]]  Level of Care: Med-Surg [16]  May admit patient to [facility] or [facility] if equivalent level of care is available:: No  Covid Evaluation: Asymptomatic - no recent exposure (last 10 days) testing not required  Diagnosis: DKA (diabetic ketoacidosis) [facility]) [[id number]]  Admitting Physician: Lequita Halt [[id number]]  Attending Physician: Lequita Halt [[id number]]  Certification:: I certify this patient will need inpatient services for at least 2 midnights          B Medical/Surgery History Past Medical History:  Diagnosis Date   Allergy    Arthritis    Asthma    Diabetes mellitus without complication ([place])    GERD (gastroesophageal reflux disease)    HTN (hypertension)    Past Surgical History:  Procedure Laterality Date   TONSILLECTOMY AND ADENOIDECTOMY  11/24/1958   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   Procedure: RIGHT TOTAL HIP REPLACEMENT;  Surgeon: Leandrew Koyanagi, MD;  Location: [facility];  Service: Orthopedics;  Laterality: Right;   WISDOM TOOTH EXTRACTION       A IV Location/Drains/Wounds Patient Lines/Drains/Airways Status     Active Line/Drains/Airways     Name Placement date Placement time Site  Days   Peripheral IV 11/24/22 18 G Anterior;Left Forearm 11/24/22  1138  Forearm  less than 1   Peripheral IV 11/24/22 20 G Anterior;Left;Proximal Forearm 11/24/22  1426  Forearm  less than 1   Incision (Closed) 03/24/22 Hip 03/24/22  1405  -- 245            Intake/Output Last 24 hours No intake or output data in the 24 hours ending 11/24/22 1628  Labs/Imaging Results for orders placed or performed during the hospital encounter of 11/24/22 (from the past 48 hour(s))  CBG monitoring, ED     Status: Abnormal   Collection Time: 11/24/22  8:19 AM  Result Value Ref Range   Glucose-Capillary 286 (H) 70 - 99 mg/dL    Comment: Glucose reference range applies only to samples taken after fasting for at least 8 hours.  Lipase, blood     Status: None   Collection Time: 11/24/22  8:25 AM  Result Value Ref Range   Lipase 30 11 - 51 U/L    Comment: Performed at [facility] [address], [place], [place] [address]  Comprehensive metabolic panel     Status: Abnormal   Collection Time: 11/24/22  8:25 AM  Result Value Ref Range   Sodium 135 135 - 145 mmol/L   Potassium 4.0 3.5 - 5.1 mmol/L   Chloride 100 98 - 111 mmol/L   CO2 16 (L) 22 - 32 mmol/L   Glucose, Bld 304 (H) 70 - 99 mg/dL  Comment: Glucose reference range applies only to samples taken after fasting for at least 8 hours.   BUN 32 (H) 8 - 23 mg/dL   Creatinine, Ser 2.951.52 (H) 0.61 - 1.24 mg/dL   Calcium 9.5 8.9 - 62.110.3 mg/dL   Total Protein 8.6 (H) 6.5 - 8.1 g/dL   Albumin 4.5 3.5 - 5.0 g/dL   AST 17 15 - 41 U/L   ALT 18 0 - 44 U/L   Alkaline Phosphatase 69 38 - 126 U/L   Total Bilirubin 1.3 (H) 0.3 - 1.2 mg/dL   GFR, Estimated 50 (L) >60 mL/min    Comment: (NOTE) Calculated using the CKD-EPI Creatinine Equation (2021)    Anion gap 19 (H) 5 - 15    Comment: Performed at [facility], [address] [place], [place] [address]  CBC     Status: Abnormal   Collection Time: 11/24/22  8:25 AM  Result Value Ref  Range   WBC 12.9 (H) 4.0 - 10.5 K/uL   RBC 5.15 4.22 - 5.81 MIL/uL   Hemoglobin 15.2 13.0 - 17.0 g/dL   HCT 78.446.3 69.639.0 - 29.552.0 %   MCV 89.9 80.0 - 100.0 fL   MCH 29.5 26.0 - 34.0 pg   MCHC 32.8 30.0 - 36.0 g/dL   RDW 28.412.9 13.211.5 - 44.015.5 %   Platelets 281 150 - 400 K/uL   nRBC 0.0 0.0 - 0.2 %    Comment: Performed at [facility], [address] [place], [place] [address]  Urinalysis, Routine w reflex microscopic Urine, Clean Catch     Status: Abnormal   Collection Time: 11/24/22  8:25 AM  Result Value Ref Range   Color, Urine YELLOW YELLOW   APPearance CLEAR CLEAR   Specific Gravity, Urine 1.026 1.005 - 1.030   pH 5.0 5.0 - 8.0   Glucose, UA >=500 (A) NEGATIVE mg/dL   Hgb urine dipstick NEGATIVE NEGATIVE   Bilirubin Urine NEGATIVE NEGATIVE   Ketones, ur 80 (A) NEGATIVE mg/dL   Protein, ur 536100 (A) NEGATIVE mg/dL   Nitrite NEGATIVE NEGATIVE   Leukocytes,Ua NEGATIVE NEGATIVE   RBC / HPF 0-5 0 - 5 RBC/hpf   WBC, UA 0-5 0 - 5 WBC/hpf   Bacteria, UA NONE SEEN NONE SEEN   Squamous Epithelial / LPF 0-5 0 - 5 /HPF   Mucus PRESENT    Hyaline Casts, UA PRESENT    Granular Casts, UA PRESENT     Comment: Performed at [facility], [address] [place], [place] [address]  I-Stat venous blood gas, [facility], [facility], [facility])     Status: Abnormal   Collection Time: 11/24/22 12:22 PM  Result Value Ref Range   pH, Ven 7.321 7.25 - 7.43   pCO2, Ven 29.8 (L) 44 - 60 mmHg   pO2, Ven 51 (H) 32 - 45 mmHg   Bicarbonate 15.4 (L) 20.0 - 28.0 mmol/L   TCO2 16 (L) 22 - 32 mmol/L   O2 Saturation 84 %   Acid-base deficit 9.0 (H) 0.0 - 2.0 mmol/L   Sodium 137 135 - 145 mmol/L   Potassium 4.1 3.5 - 5.1 mmol/L   Calcium, Ion 1.17 1.15 - 1.40 mmol/L   HCT 45.0 39.0 - 52.0 %   Hemoglobin 15.3 13.0 - 17.0 g/dL   Sample type VENOUS   POC CBG, ED     Status: Abnormal   Collection Time: 11/24/22  1:46 PM  Result Value Ref Range  Glucose-Capillary 248 (H) 70 - 99 mg/dL    Comment: Glucose reference  range applies only to samples taken after fasting for at least 8 hours.  CBG monitoring, ED     Status: Abnormal   Collection Time: 11/24/22  2:25 PM  Result Value Ref Range   Glucose-Capillary 280 (H) 70 - 99 mg/dL    Comment: Glucose reference range applies only to samples taken after fasting for at least 8 hours.  CBG monitoring, ED     Status: Abnormal   Collection Time: 11/24/22  3:34 PM  Result Value Ref Range   Glucose-Capillary 165 (H) 70 - 99 mg/dL    Comment: Glucose reference range applies only to samples taken after fasting for at least 8 hours.   No results found.  Pending Labs Unresulted Labs (From admission, onward)     Start     Ordered   11/25/22 0500  CBC  Tomorrow morning,   R        11/24/22 1433   11/25/22 8563  Basic metabolic panel  Tomorrow morning,   R        11/24/22 1433   11/24/22 1497  Basic metabolic panel  Once-Timed,   TIMED        11/24/22 1546   11/24/22 1433  HIV Antibody (routine testing w rflx)  (HIV Antibody (Routine testing w reflex) panel)  Once,   R        11/24/22 1433   11/24/22 1433  Hemoglobin A1c  (Diabetes Ketoacidosis (DKA))  Once,   R       Comments: To assess prior glycemic control.    11/24/22 1433            Vitals/Pain Today's Vitals   11/24/22 1150 11/24/22 1346 11/24/22 1400 11/24/22 1500  BP: (!) 107/90 (!) 155/65  (!) 149/58  Pulse: (!) 109 (!) 104  99  Resp: 18 19  20   Temp:  97.7 F (36.5 C)    TempSrc:  Oral    SpO2: 100% 100%  99%  Weight:   78.5 kg   PainSc:  0-No pain      Isolation Precautions No active isolations  Medications Medications  dextrose 50 % solution 0-50 mL (has no administration in time range)  potassium chloride 10 mEq in 100 mL IVPB (10 mEq Intravenous New Bag/Given 11/24/22 1548)  ondansetron (ZOFRAN) injection 4 mg (has no administration in time range)  aspirin EC tablet 81 mg (has no administration in time range)  rosuvastatin (CRESTOR) tablet 20 mg (has no administration in  time range)  hydrALAZINE (APRESOLINE) tablet 25 mg (has no administration in time range)  docusate sodium (COLACE) capsule 100 mg (has no administration in time range)  pantoprazole (PROTONIX) EC tablet 40 mg (has no administration in time range)  methocarbamol (ROBAXIN) tablet 500 mg (has no administration in time range)  albuterol (VENTOLIN HFA) 108 (90 Base) MCG/ACT inhaler 1-2 puff (has no administration in time range)  montelukast (SINGULAIR) tablet 10 mg (has no administration in time range)  enoxaparin (LOVENOX) injection 40 mg (has no administration in time range)  lactated ringers bolus 1,570 mL (has no administration in time range)  metoCLOPramide (REGLAN) injection 10 mg (has no administration in time range)  insulin glargine-yfgn (SEMGLEE) injection 30 Units (has no administration in time range)  insulin aspart (novoLOG) injection 0-20 Units (has no administration in time range)  insulin aspart (novoLOG) injection 0-5 Units (has no administration in time range)  0.9 %  sodium chloride infusion (has no administration in time range)  ondansetron (ZOFRAN-ODT) disintegrating tablet 4 mg (4 mg Oral Given 11/24/22 0842)  lactated ringers bolus 1,000 mL (0 mLs Intravenous Stopped 11/24/22 1249)  metoCLOPramide (REGLAN) injection 10 mg (10 mg Intravenous Given 11/24/22 1143)  lactated ringers bolus 1,000 mL (0 mLs Intravenous Stopped 11/24/22 1339)  ondansetron (ZOFRAN) injection 4 mg (4 mg Intravenous Given 11/24/22 1438)    Mobility walks     Focused Assessments     R Recommendations: See Admitting Provider Note  Report given to:   Additional Notes:

## 2022-11-24 NOTE — H&P (Addendum)
History and Physical    Antonio Baldwin. NWG:[id number] DOB: [birth year] DOA: 11/24/2022  PCP: Janith Lima, MD (Confirm with patient/family/NH records and if not entered, this has to be entered at [facility] point of entry) Patient coming from: Home  I have personally briefly reviewed patient's old medical records in [place]  Chief Complaint: Nauseous vomiting diarrhea, feeling tired  HPI: Antonio Baldwin. is a 67 y.o. 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  Patient ate a salad for dinner 3 days ago and then started to have persistent nauseous vomiting diarrhea, diarrhea subsided yesterday, but continued to have nauseous and vomiting of stomach content, nonbloody none bile, unable to take any food or fluids down for last 2 days and became very dehydrated.  Wife checked her sugar yesterday several times more than 600 and routine dose of insulin given and this morning glucose was 500.    Patient had a left hip replacement about 6 months ago and since then patient has had episodes of easily to get full and had to eat small meals, and poor appetite and overall lost about 50 pounds, although family attributed to [place] however the patient has been out of Jardiance for 1+ month and is eating habit still remains the same.  He does admitted to have history of gastroparesis before and was treated.  ED Course: Patient was found eating in DKA with glucose 300, AKI creatinine 1.5 compared to baseline 0.7, bicarb 16 anion gap 19.  Patient was started on insulin drip, total of 2 L IV bolus given.  Review of Systems: As per HPI otherwise 14 point review of systems negative.    Past Medical History:  Diagnosis Date   Allergy    Arthritis    Asthma    Diabetes mellitus without complication (HCC)    GERD  (gastroesophageal reflux disease)    HTN (hypertension)     Past Surgical History:  Procedure Laterality Date   TONSILLECTOMY AND ADENOIDECTOMY  11/24/1958   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   Procedure: RIGHT TOTAL HIP REPLACEMENT;  Surgeon: Leandrew Koyanagi, MD;  Location: [place];  Service: Orthopedics;  Laterality: Right;   WISDOM TOOTH EXTRACTION       reports that he quit smoking about 20 years ago. His smoking use included cigarettes. He has never used smokeless tobacco. He reports current drug use. Drug: Marijuana. He reports that he does not drink alcohol.  No Known Allergies  Family History  Problem Relation Age of Onset   Arthritis Other    Hypertension Other    Diabetes Other    Arthritis Mother    Hypertension Mother    Diabetes Mother    Heart disease Neg Hx    Early death Neg Hx    Stroke Neg Hx      Prior to Admission medications   Medication Sig Start Date End Date Taking? Authorizing Provider  albuterol (VENTOLIN HFA) 108 (90 Base) MCG/ACT inhaler Inhale 1-2 puffs into the lungs every 4 (four) hours as needed for wheezing or shortness of breath.    [provider]  amLODipine (NORVASC) 10 MG tablet Take 10 mg by mouth daily.    [provider]  aspirin EC 81 MG tablet Take 1 tablet (81 mg total) by mouth 2 (two) times daily. To be taken after surgery to  prevent blood clots 03/17/22   Cristie Hem, PA-C  docusate sodium (COLACE) 100 MG capsule Take 1 capsule (100 mg total) by mouth daily as needed. 03/17/22 03/17/23  Cristie Hem, PA-C  empagliflozin (JARDIANCE) 25 MG TABS tablet Take 25 mg by mouth daily.    [provider]  insulin glargine (LANTUS) 100 UNIT/ML injection Inject 0.5 mLs (50 Units total) into the skin daily. Patient taking differently: Inject 30 Units into the skin daily. 04/15/13   Etta Grandchild, MD  metFORMIN (GLUCOPHAGE) 500 MG tablet Take 500 mg by mouth 2 (two) times daily with a meal.    [provider]  methocarbamol (ROBAXIN) 500 MG tablet Take 1 tablet (500 mg total) by mouth 2 (two) times daily as needed. To be taken after surgery 03/17/22   Cristie Hem, PA-C  montelukast (SINGULAIR) 10 MG tablet Take 10 mg by mouth daily.    [provider]  omeprazole (PRILOSEC) 20 MG capsule Take 20 mg by mouth daily.    [provider]  ondansetron (ZOFRAN) 4 MG tablet Take 1 tablet (4 mg total) by mouth every 8 (eight) hours as needed for nausea or vomiting. 03/17/22   Cristie Hem, PA-C  oxyCODONE-acetaminophen (PERCOCET) 5-325 MG tablet Take 1-2 tablets by mouth every 6 (six) hours as needed. To be taken after surgery 03/17/22   Cristie Hem, PA-C  rosuvastatin (CRESTOR) 20 MG tablet Take 1 tablet (20 mg total) by mouth daily. 06/21/21   Etta Grandchild, MD  Semaglutide,0.25 or 0.5MG /DOS, (OZEMPIC, 0.25 OR 0.5 MG/DOSE,) 2 MG/1.5ML SOPN Inject 0.5 mg into the skin every Sunday.    [provider]  sulfamethoxazole-trimethoprim (BACTRIM DS) 800-160 MG tablet Take 1 tablet by mouth 2 (two) times daily. To be taken after surgery 03/17/22   Cristie Hem, [place]    Physical Exam: Vitals:   11/24/22 2595 11/24/22 1150 11/24/22 1346 11/24/22 1400  BP: (!) 144/83 (!) 107/90 (!) 155/65   Pulse: (!) 126 (!) 109 (!) 104   Resp: (!) 22 18 19    Temp: 98.8 F (37.1 C)  97.7 F (36.5 C)   TempSrc: Oral  Oral   SpO2: 100% 100% 100%   Weight:    78.5 kg    Constitutional: NAD, calm, comfortable Vitals:   11/24/22 0821 11/24/22 1150 11/24/22 1346 11/24/22 1400  BP: (!) 144/83 (!) 107/90 (!) 155/65   Pulse: (!) 126 (!) 109 (!) 104   Resp: (!) 22 18 19    Temp: 98.8 F (37.1 C)  97.7 F (36.5 C)   TempSrc: Oral  Oral   SpO2: 100% 100% 100%   Weight:    78.5 kg   Eyes: PERRL, lids and conjunctivae normal ENMT: Mucous membranes are dry. Posterior pharynx clear of any exudate or lesions.Normal dentition.  Neck: normal, supple, no masses, no  thyromegaly Respiratory: clear to auscultation bilaterally, no wheezing, no crackles. Normal respiratory effort. No accessory muscle use.  Cardiovascular: Regular rate and rhythm, no murmurs / rubs / gallops. No extremity edema. 2+ pedal pulses. No carotid bruits.  Abdomen: no tenderness, no masses palpated. No hepatosplenomegaly. Bowel sounds positive.  Nontender nondistended Musculoskeletal: no clubbing / cyanosis. No joint deformity upper and lower extremities. Good ROM, no contractures. Normal muscle tone.  Skin: no rashes, lesions, ulcers. No induration Neurologic: CN 2-12 grossly intact. Sensation intact, DTR normal. Strength 5/5 in all 4.  Psychiatric: Normal judgment and insight. Alert and oriented x 3. Normal mood.  Labs on Admission: I have personally reviewed following labs and imaging studies  CBC: Recent Labs  Lab 11/24/22 0825 11/24/22 1222  WBC 12.9*  --   HGB 15.2 15.3  HCT 46.3 45.0  MCV 89.9  --   PLT 281  --    Basic Metabolic Panel: Recent Labs  Lab 11/24/22 0825 11/24/22 1222  NA 135 137  K 4.0 4.1  CL 100  --   CO2 16*  --   GLUCOSE 304*  --   BUN 32*  --   CREATININE 1.52*  --   CALCIUM 9.5  --    GFR: CrCl cannot be calculated (Unknown ideal weight.). Liver Function Tests: Recent Labs  Lab 11/24/22 0825  AST 17  ALT 18  ALKPHOS 69  BILITOT 1.3*  PROT 8.6*  ALBUMIN 4.5   Recent Labs  Lab 11/24/22 0825  LIPASE 30   No results for input(s): "AMMONIA" in the last 168 hours. Coagulation Profile: No results for input(s): "INR", "PROTIME" in the last 168 hours. Cardiac Enzymes: No results for input(s): "CKTOTAL", "CKMB", "CKMBINDEX", "TROPONINI" in the last 168 hours. BNP (last 3 results) No results for input(s): "PROBNP" in the last 8760 hours. HbA1C: No results for input(s): "HGBA1C" in the last 72 hours. CBG: Recent Labs  Lab 11/24/22 0819 11/24/22 1346 11/24/22 1425  GLUCAP 286* 248* 280*   Lipid Profile: No results for  input(s): "CHOL", "HDL", "LDLCALC", "TRIG", "CHOLHDL", "LDLDIRECT" in the last 72 hours. Thyroid Function Tests: No results for input(s): "TSH", "T4TOTAL", "FREET4", "T3FREE", "THYROIDAB" in the last 72 hours. Anemia Panel: No results for input(s): "VITAMINB12", "FOLATE", "FERRITIN", "TIBC", "IRON", "RETICCTPCT" in the last 72 hours. Urine analysis:    Component Value Date/Time   COLORURINE YELLOW 11/24/2022 0825   APPEARANCEUR CLEAR 11/24/2022 0825   LABSPEC 1.026 11/24/2022 0825   PHURINE 5.0 11/24/2022 0825   GLUCOSEU >=500 (A) 11/24/2022 0825   GLUCOSEU 250 (A) 06/20/2021 1044   HGBUR NEGATIVE 11/24/2022 0825   BILIRUBINUR NEGATIVE 11/24/2022 0825   KETONESUR 80 (A) 11/24/2022 0825   PROTEINUR 100 (A) 11/24/2022 0825   UROBILINOGEN 0.2 06/20/2021 1044   NITRITE NEGATIVE 11/24/2022 0825   LEUKOCYTESUR NEGATIVE 11/24/2022 0825    Radiological Exams on Admission: No results found.  EKG: Independently reviewed.  Sinus, no acute ST changes.  Assessment/Plan Principal Problem:   DKA (diabetic ketoacidosis) (HCC) Active Problems:   Essential hypertension, benign   Type II diabetes mellitus with manifestations (HCC)   AKI (acute kidney injury) (HCC)  (please populate well all problems here in Problem List. (For example, if patient is on BP meds at home and you resume or decide to hold them, it is a problem that needs to be her. Same for CAD, COPD, HLD and so on)   Starvation ketoacidosis versus DKA -Compensated combined high anion gap metabolic acidosis and acute respiratory alkalosis -Clinical history of severe dehydrated, will order another 3 L of IV bolus -Insulin drip as per Endo tool  Intractable nauseous vomiting diarrhea -Initial phase appears to be some kind of food toxin versus gastroenteritis but may also have element of acute on chronic gastroparesis as his recent weight loss and change of eating habits. -Trial of Reglan 10 mg to 6 hours x 3 days -As needed  Zofran -Other DDx, abdominal exam benign and GI symptoms appears to be improving, hold off further imaging.  AKI -Severe volume contraction secondary to GI symptoms -IV bolus x 3, continue IV fluid  IDDM -Insulin drip,  hold off Ozempic and metformin  HTN -Hold off metformin  Asthma, OA -Stable, no acute concerns  DVT prophylaxis: Lovenox Code Status: Full code Family Communication: Wife at bedside Disposition Plan: Expect 1 to 2 days hospital stay Consults called: None Admission status: PCU floor insulin drip   Lequita Halt MD [facility] Pager [phone]  11/24/2022, 3:25 PM

## 2022-11-25 DIAGNOSIS — E1110 Type 2 diabetes mellitus with ketoacidosis without coma: Secondary | ICD-10-CM | POA: Diagnosis not present

## 2022-11-25 LAB — BASIC METABOLIC PANEL
Anion gap: 11 (ref 5–15)
BUN: 15 mg/dL (ref 8–23)
CO2: 22 mmol/L (ref 22–32)
Calcium: 8.3 mg/dL — ABNORMAL LOW (ref 8.9–10.3)
Chloride: 106 mmol/L (ref 98–111)
Creatinine, Ser: 0.79 mg/dL (ref 0.61–1.24)
GFR, Estimated: >60 mL/min (ref >60)
Glucose, Bld: 77 mg/dL (ref 70–99)
Potassium: 3.2 mmol/L — ABNORMAL LOW (ref 3.5–5.1)
Sodium: 139 mmol/L (ref 135–145)

## 2022-11-25 LAB — CBC
HCT: 34.2 % — ABNORMAL LOW (ref 39.0–52.0)
Hemoglobin: 11.6 g/dL — ABNORMAL LOW (ref 13.0–17.0)
MCH: 30.2 pg (ref 26.0–34.0)
MCHC: 33.9 g/dL (ref 30.0–36.0)
MCV: 89.1 fL (ref 80.0–100.0)
Platelets: 184 10*3/uL (ref 150–400)
RBC: 3.84 MIL/uL — ABNORMAL LOW (ref 4.22–5.81)
RDW: 13 % (ref 11.5–15.5)
WBC: 9.8 10*3/uL (ref 4.0–10.5)
nRBC: 0 % (ref 0.0–0.2)

## 2022-11-25 LAB — GLUCOSE, CAPILLARY
Glucose-Capillary: 65 mg/dL — ABNORMAL LOW (ref 70–99)
Glucose-Capillary: 129 mg/dL — ABNORMAL HIGH (ref 70–99)
Glucose-Capillary: 123 mg/dL — ABNORMAL HIGH (ref 70–99)

## 2022-11-25 MED ORDER — POTASSIUM CHLORIDE CRYS ER 20 MEQ PO TBCR
40.0000 meq | EXTENDED_RELEASE_TABLET | ORAL | Status: DC
  Administered 2022-11-25: 40 meq via ORAL
  Filled 2022-11-25: qty 2

## 2022-11-25 NOTE — Plan of Care (Signed)

## 2022-11-25 NOTE — Progress Notes (Signed)
Discharge instructions given to pt and family member. Voiced understanding

## 2022-11-25 NOTE — Progress Notes (Signed)
Inpatient Diabetes Program Recommendations  AACE/ADA: New Consensus Statement on Inpatient Glycemic Control (2015)  Target Ranges:  Prepandial:   less than 140 mg/dL      Peak postprandial:   less than 180 mg/dL (1-2 hours)      Critically ill patients:  140 - 180 mg/dL   Lab Results  Component Value Date   GLUCAP 65 (L) 11/25/2022   HGBA1C 6.7 (H) 03/12/2022    Review of Glycemic Control  Latest Reference Range & Units 11/24/22 15:34 11/24/22 17:36 11/24/22 20:55 11/25/22 08:42  Glucose-Capillary 70 - 99 mg/dL 165 (H) 100 (H) 111 (H) 65 (L)   Diabetes history: DM  Outpatient Diabetes medications:  Jardiance 25 mg daily Lantus 30 units daily Metformin 500 mg bid Ozempic 0.5 mg weekly Current orders for Inpatient glycemic control:  Novolog 0-20 units tid with meals and HS Semglee 30 units daily  Inpatient Diabetes Program Recommendations:    Consider reducing Semglee to 25 units daily and also reduce Novolog to moderate tid with meals.    Thanks,  Adah Perl, RN, BC-ADM Inpatient Diabetes Coordinator Pager [phone]  (8a-5p)

## 2022-11-25 NOTE — Discharge Summary (Signed)
Physician Discharge Summary  Antonio Baldwin. LKG:[id number] DOB: [birth year] DOA: 11/24/2022  PCP: Janith Lima, MD  Admit date: 11/24/2022 Discharge date: 11/25/2022 30 Day Unplanned Readmission Risk Score    Flowsheet Row ED to Hosp-Admission (Current) from 11/24/2022 in [facility]  30 Day Unplanned Readmission Risk Score (%) 11.65 Filed at 11/25/2022 0801       This score is the patient's risk of an unplanned readmission within 30 days of being discharged (0 -100%). The score is based on dignosis, age, lab data, medications, orders, and past utilization.   Low:  0-14.9   Medium: 15-21.9   High: 22-29.9   Extreme: 30 and above          Admitted From: Home Disposition: Home  Recommendations for Outpatient Follow-up:  Follow up with PCP in 1-2 weeks Please obtain BMP/CBC in one week Please follow up with your PCP on the following pending results: Unresulted Labs (From admission, onward)     Start     Ordered   11/24/22 1433  Hemoglobin A1c  (Diabetes Ketoacidosis (DKA))  Once,   R       Comments: To assess prior glycemic control.    11/24/22 [place]: None Equipment/Devices: None  Discharge Condition: Stable CODE STATUS: Full code Diet recommendation: Diabetic  Subjective: Seen and examined.  Feels better.  Ready to go home.  HPI: Antonio Baldwin. is a 67 y.o. 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   Patient ate a salad for dinner 3 days ago and then started to have persistent nauseous vomiting diarrhea, diarrhea subsided yesterday, but continued to have nauseous and vomiting of stomach content, nonbloody none bile, unable to take any food or fluids down for last 2 days and became very dehydrated.  Wife checked her sugar yesterday several times more  than 600 and routine dose of insulin given and this morning glucose was 500.     Patient had a left hip replacement about 6 months ago and since then patient has had episodes of easily to get full and had to eat small meals, and poor appetite and overall lost about 50 pounds, although family attributed to [place] however the patient has been out of Jardiance for 1+ month and is eating habit still remains the same.  He does admitted to have history of gastroparesis before and was treated.   ED Course: Patient was found eating in DKA with glucose 300, AKI creatinine 1.5 compared to baseline 0.7, bicarb 16 anion gap 19.  Brief/Interim Summary: Patient was admitted under hospitalist service secondary to starvation ketoacidosis and DKA, he was given significant amount of fluids, over 3 L and was started on insulin drip and subsequently his DKA resolved and he was transition to long-acting insulin with SSI.  He also had intractable nausea and vomiting.  Did not have any abdominal pain, no abdominal imaging was done.  He also had dehydration causing AKI which resolved with IV fluids as well.  Patient is feeling better, tolerating regular diet with mild nausea but overall feeling much better.  Labs are looking good except mild hypokalemia of 3.2.  Patient feels comfortable going home.  He will be discharged after potassium will be replenished today.  He will resume all  his home medications.  Discharge plan was discussed with patient and/or family member and they verbalized understanding and agreed with it.  Discharge Diagnoses:  Principal Problem:   DKA (diabetic ketoacidosis) (HCC) Active Problems:   Essential hypertension, benign   Type II diabetes mellitus with manifestations (HCC)   AKI (acute kidney injury) (HCC)    Discharge Instructions   Allergies as of 11/25/2022   No Known Allergies      Medication List     STOP taking these medications    oxyCODONE-acetaminophen 5-325 MG  tablet Commonly known as: Percocet   sulfamethoxazole-trimethoprim 800-160 MG tablet Commonly known as: BACTRIM DS       TAKE these medications    albuterol 108 (90 Base) MCG/ACT inhaler Commonly known as: VENTOLIN HFA Inhale 1-2 puffs into the lungs every 4 (four) hours as needed for wheezing or shortness of breath.   amLODipine 10 MG tablet Commonly known as: NORVASC Take 10 mg by mouth daily.   aspirin EC 81 MG tablet Take 1 tablet (81 mg total) by mouth 2 (two) times daily. To be taken after surgery to prevent blood clots   docusate sodium 100 MG capsule Commonly known as: Colace Take 1 capsule (100 mg total) by mouth daily as needed. What changed: reasons to take this   empagliflozin 25 MG Tabs tablet Commonly known as: JARDIANCE Take 25 mg by mouth daily.   insulin glargine 100 UNIT/ML injection Commonly known as: LANTUS Inject 0.5 mLs (50 Units total) into the skin daily. What changed: how much to take   metFORMIN 500 MG tablet Commonly known as: GLUCOPHAGE Take 500 mg by mouth 2 (two) times daily with a meal.   methocarbamol 500 MG tablet Commonly known as: Robaxin Take 1 tablet (500 mg total) by mouth 2 (two) times daily as needed. To be taken after surgery What changed: reasons to take this   montelukast 10 MG tablet Commonly known as: SINGULAIR Take 10 mg by mouth daily.   omeprazole 20 MG capsule Commonly known as: PRILOSEC Take 20 mg by mouth daily.   ondansetron 4 MG tablet Commonly known as: Zofran Take 1 tablet (4 mg total) by mouth every 8 (eight) hours as needed for nausea or vomiting.   Ozempic (0.25 or 0.5 MG/DOSE) 2 MG/1.5ML Sopn Generic drug: Semaglutide(0.25 or 0.5MG /DOS) Inject 0.5 mg into the skin every Sunday.   rosuvastatin 20 MG tablet Commonly known as: CRESTOR Take 1 tablet (20 mg total) by mouth daily.        Follow-up Information     Etta Grandchild, MD Follow up in 1 week(s).   Specialty: Internal  Medicine Contact information: [address] [place] [address] [phone]                No Known Allergies  Consultations: None   Procedures/Studies: DG Abd 2 Views  Result Date: 11/24/2022 CLINICAL DATA:  Nausea vomiting EXAM: ABDOMEN - 2 VIEW COMPARISON:  CT 06/08/2021 FINDINGS: No free air beneath the diaphragm. Focal gas-filled loop of small bowel measuring 3 cm maximum in the left mid abdomen. Scattered colon and rectal gas is present. Right hip replacement. No radiopaque calculi. IMPRESSION: Focal gas-filled loop of small bowel in the left mid abdomen, nonspecific, could be secondary to focal ileus or enteritis. Electronically Signed   By: Jasmine Pang M.D.   On: 11/24/2022 17:09     Discharge Exam: Vitals:   11/25/22 0600 11/25/22 0843  BP: (!) 148/57 (!) 120/52  Pulse: 90 89  Resp: 16 18  Temp: 98.5 F (36.9 C) 99.1 F (37.3 C)  SpO2: 99% 100%   Vitals:   11/24/22 1500 11/24/22 1709 11/25/22 0600 11/25/22 0843  BP: (!) 149/58 (!) 152/64 (!) 148/57 (!) 120/52  Pulse: 99 100 90 89  Resp: 20 18 16 18   Temp:  98 F (36.7 C) 98.5 F (36.9 C) 99.1 F (37.3 C)  TempSrc:  Oral Oral Oral  SpO2: 99% 97% 99% 100%  Weight:      Height:  5\' 8"  (1.727 m)      General: Pt is alert, awake, not in acute distress Cardiovascular: RRR, S1/S2 +, no rubs, no gallops Respiratory: CTA bilaterally, no wheezing, no rhonchi Abdominal: Soft, NT, ND, bowel sounds + Extremities: no edema, no cyanosis    The results of significant diagnostics from this hospitalization (including imaging, microbiology, ancillary and laboratory) are listed below for reference.     Microbiology: No results found for this or any previous visit (from the past 240 hour(s)).   Labs: BNP (last 3 results) No results for input(s): "BNP" in the last 8760 hours. Basic Metabolic Panel: Recent Labs  Lab 11/24/22 0825 11/24/22 1222 11/24/22 1745 11/25/22 0317  NA 135 137 137 139  K  4.0 4.1 3.6 3.2*  CL 100  --  105 106  CO2 16*  --  19* 22  GLUCOSE 304*  --  105* 77  BUN 32*  --  23 15  CREATININE 1.52*  --  1.00 0.79  CALCIUM 9.5  --  8.5* 8.3*   Liver Function Tests: Recent Labs  Lab 11/24/22 0825  AST 17  ALT 18  ALKPHOS 69  BILITOT 1.3*  PROT 8.6*  ALBUMIN 4.5   Recent Labs  Lab 11/24/22 0825  LIPASE 30   No results for input(s): "AMMONIA" in the last 168 hours. CBC: Recent Labs  Lab 11/24/22 0825 11/24/22 1222 11/25/22 0317  WBC 12.9*  --  9.8  HGB 15.2 15.3 11.6*  HCT 46.3 45.0 34.2*  MCV 89.9  --  89.1  PLT 281  --  184   Cardiac Enzymes: No results for input(s): "CKTOTAL", "CKMB", "CKMBINDEX", "TROPONINI" in the last 168 hours. BNP: Invalid input(s): "POCBNP" CBG: Recent Labs  Lab 11/24/22 1534 11/24/22 1736 11/24/22 2055 11/25/22 0842 11/25/22 0932  GLUCAP 165* 100* 111* 65* 129*   D-Dimer No results for input(s): "DDIMER" in the last 72 hours. Hgb A1c No results for input(s): "HGBA1C" in the last 72 hours. Lipid Profile No results for input(s): "CHOL", "HDL", "LDLCALC", "TRIG", "CHOLHDL", "LDLDIRECT" in the last 72 hours. Thyroid function studies No results for input(s): "TSH", "T4TOTAL", "T3FREE", "THYROIDAB" in the last 72 hours.  Invalid input(s): "FREET3" Anemia work up No results for input(s): "VITAMINB12", "FOLATE", "FERRITIN", "TIBC", "IRON", "RETICCTPCT" in the last 72 hours. Urinalysis    Component Value Date/Time   COLORURINE YELLOW 11/24/2022 0825   APPEARANCEUR CLEAR 11/24/2022 0825   LABSPEC 1.026 11/24/2022 0825   PHURINE 5.0 11/24/2022 0825   GLUCOSEU >=500 (A) 11/24/2022 0825   GLUCOSEU 250 (A) 06/20/2021 1044   HGBUR NEGATIVE 11/24/2022 0825   BILIRUBINUR NEGATIVE 11/24/2022 0825   KETONESUR 80 (A) 11/24/2022 0825   PROTEINUR 100 (A) 11/24/2022 0825   UROBILINOGEN 0.2 06/20/2021 1044   NITRITE NEGATIVE 11/24/2022 0825   LEUKOCYTESUR NEGATIVE 11/24/2022 0825   Sepsis Labs Recent Labs   Lab 11/24/22 0825 11/25/22 0317  WBC 12.9* 9.8   Microbiology No results found  for this or any previous visit (from the past 240 hour(s)).   Time coordinating discharge: Over 30 minutes  SIGNED:   Darliss Cheney, MD  [facility] 11/25/2022, 10:16 AM *Please note that this is a verbal dictation therefore any spelling or grammatical errors are due to the "[place]" system interpretation. If 7PM-7AM, please contact night-coverage [email or web address]

## 2022-11-26 ENCOUNTER — Encounter: Payer: Self-pay | Admitting: *Deleted

## 2022-11-26 ENCOUNTER — Telehealth: Payer: Self-pay | Admitting: *Deleted

## 2022-11-26 ENCOUNTER — Telehealth: Payer: Self-pay | Admitting: Orthopaedic Surgery

## 2022-11-26 LAB — HEMOGLOBIN A1C
Hgb A1c MFr Bld: >15.5 % — ABNORMAL HIGH (ref 4.8–5.6)
Mean Plasma Glucose: >398 mg/dL

## 2022-11-26 NOTE — Progress Notes (Signed)
  Care Coordination  Note  11/26/2022 Name: Antonio Baldwin. MRN: [id number] DOB: August 12, 1956  Antonio Chris. is a 67 y.o. year old primary care patient of Janith Lima, MD. I reached out to Antonio Chris. by phone today to assist with scheduling a follow up appointment. Antonio Chris. verbally consented to my assistance.       Follow up plan: Hospital Follow Up appointment scheduled with (Dr Ronnald Ramp) on (12/01/2022) at (820am).  Julian Hy, [place] Direct Dial: [phone]

## 2022-11-26 NOTE — Patient Outreach (Signed)
  Care Coordination [facility] Note Transition Care Management Follow-up Telephone Call Date of discharge and from where: Tuesday, November 25, 2022 Antonio Baldwin; DKA; starvation ketoacidosis/ dehydration from nausea/ vomiting How have you been since you were released from the hospital? Per patient and his spouse Antonio Baldwin, on [facility] DPR: "He is doing some better and I am checking his blood sugar 3 or 4 times a day and they are better; although at times they are still running high- I am giving him the insulin and the metformin and making sure he is eating something.  Yes, have someone call us to schedule with Dr. Ronnald Ramp for follow up.  He has to get all of his medicines from the [place], or they won't be paid for-- but I will call the [place] myself and set that appointment up.  His blood sugar this morning was over 200, but after I gave him his medicine, it has now come down to 170-- we just checked it" Any questions or concerns? No  Items Reviewed: Did the pt receive and understand the discharge instructions provided? Yes  Medications obtained and verified? Yes - verified no medication changes post-hospital discharge; patient and spouse decline full medication review, state they are not near his medications Other? No  Any new allergies since your discharge? No  Dietary orders reviewed? Yes Do you have support at home? Yes  spouse assists with care needs   Home Care and Equipment/Supplies: Were home health services ordered? no If so, what is the name of the agency? N/A  Has the agency set up a time to come to the patient's home? not applicable Were any new equipment or medical supplies ordered?  No What is the name of the medical supply agency? N/A Were you able to get the supplies/equipment? not applicable Do you have any questions related to the use of the equipment or supplies? No N/A  Functional Questionnaire: (I = Independent and D = Dependent) ADLs: I  Bathing/Dressing- I  Meal Prep- D  Eating-  I  Maintaining continence- I  Transferring/Ambulation- I  Managing Meds- I  Follow up appointments reviewed:  PCP Hospital f/u appt confirmed? No  Scheduled to see - on - @ [facility] f/u appt confirmed? No  Scheduled to see - on - @ - Are transportation arrangements needed? No  If their condition worsens, is the pt aware to call PCP or go to the Emergency Dept.? Yes Was the patient provided with contact information for the PCP's office or ED? No- patient/ wife declined; reports already has contact information for all care providers Was to pt encouraged to call back with questions or concerns? Yes  SDOH assessments and interventions completed:   Yes SDOH Interventions Today    Flowsheet Row Most Recent Value  SDOH Interventions   Food Insecurity Interventions Intervention Not Indicated  Transportation Interventions Intervention Not Indicated  [Patient drives self,  has transportation from others as/ if indicated/ needed]      Care Coordination Interventions:  PCP follow up appointment requested Referred for Care Coordination Services:  RN Care Coordinator Reviewed recent blood sugars at home; provided education around need to stay hydrated and to use low-sugar nutritional supplements    Encounter Outcome:  Pt. Visit Completed

## 2022-11-26 NOTE — Telephone Encounter (Signed)
Patient wife called in stating they got a letter from [place] stating they will approve patient referral to be seen for his right hip for a follow up if an appt is set within 7 days of letter date which the patient just got yesterday but it was dated for 11/01/22. A appt has been set and Auth number is attached please advise and call [phone] with questions [facility] auth # [id number]

## 2022-12-01 ENCOUNTER — Encounter: Payer: Self-pay | Admitting: Internal Medicine

## 2022-12-01 ENCOUNTER — Other Ambulatory Visit (INDEPENDENT_AMBULATORY_CARE_PROVIDER_SITE_OTHER): Payer: No Typology Code available for payment source

## 2022-12-01 ENCOUNTER — Ambulatory Visit (INDEPENDENT_AMBULATORY_CARE_PROVIDER_SITE_OTHER): Payer: No Typology Code available for payment source | Admitting: Internal Medicine

## 2022-12-01 VITALS — BP 126/72 | HR 83 | Temp 98.4°F | Ht 68.0 in | Wt 166.0 lb

## 2022-12-01 DIAGNOSIS — D539 Nutritional anemia, unspecified: Secondary | ICD-10-CM

## 2022-12-01 DIAGNOSIS — E119 Type 2 diabetes mellitus without complications: Secondary | ICD-10-CM

## 2022-12-01 DIAGNOSIS — E785 Hyperlipidemia, unspecified: Secondary | ICD-10-CM | POA: Diagnosis not present

## 2022-12-01 DIAGNOSIS — E876 Hypokalemia: Secondary | ICD-10-CM

## 2022-12-01 DIAGNOSIS — I10 Essential (primary) hypertension: Secondary | ICD-10-CM

## 2022-12-01 DIAGNOSIS — Z125 Encounter for screening for malignant neoplasm of prostate: Secondary | ICD-10-CM

## 2022-12-01 DIAGNOSIS — Z794 Long term (current) use of insulin: Secondary | ICD-10-CM

## 2022-12-01 DIAGNOSIS — E139 Other specified diabetes mellitus without complications: Secondary | ICD-10-CM

## 2022-12-01 DIAGNOSIS — R7989 Other specified abnormal findings of blood chemistry: Secondary | ICD-10-CM

## 2022-12-01 LAB — HEPATIC FUNCTION PANEL
ALT: 14 U/L (ref 0–53)
AST: 12 U/L (ref 0–37)
Albumin: 3.9 g/dL (ref 3.5–5.2)
Alkaline Phosphatase: 50 U/L (ref 39–117)
Bilirubin, Direct: 0.1 mg/dL (ref 0.0–0.3)
Total Bilirubin: 0.3 mg/dL (ref 0.2–1.2)
Total Protein: 6.4 g/dL (ref 6.0–8.3)

## 2022-12-01 LAB — VITAMIN B12: Vitamin B-12: 590 pg/mL (ref 211–911)

## 2022-12-01 LAB — URINALYSIS, ROUTINE W REFLEX MICROSCOPIC
Hgb urine dipstick: NEGATIVE
Leukocytes,Ua: NEGATIVE
Nitrite: NEGATIVE
Specific Gravity, Urine: >=1.03 — AB (ref 1.000–1.030)
Urine Glucose: 250 — AB
Urobilinogen, UA: 1 (ref 0.0–1.0)
pH: 6 (ref 5.0–8.0)

## 2022-12-01 LAB — IBC + FERRITIN
Ferritin: 209.7 ng/mL (ref 22.0–322.0)
Iron: 49 ug/dL (ref 42–165)
Saturation Ratios: 18.3 % — ABNORMAL LOW (ref 20.0–50.0)
TIBC: 267.4 ug/dL (ref 250.0–450.0)
Transferrin: 191 mg/dL — ABNORMAL LOW (ref 212.0–360.0)

## 2022-12-01 LAB — LIPID PANEL
Cholesterol: 93 mg/dL (ref 0–200)
HDL: 37.7 mg/dL — ABNORMAL LOW (ref >39.00)
LDL Cholesterol: 29 mg/dL (ref 0–99)
NonHDL: 55.27
Total CHOL/HDL Ratio: 2
Triglycerides: 131 mg/dL (ref 0.0–149.0)
VLDL: 26.2 mg/dL (ref 0.0–40.0)

## 2022-12-01 LAB — FOLATE: Folate: 13.4 ng/mL (ref >5.9)

## 2022-12-01 LAB — PSA: PSA: 0.28 ng/mL (ref 0.10–4.00)

## 2022-12-01 MED ORDER — DEXCOM G7 SENSOR MISC: 1.0000 | Freq: Every day | 1 refills | Status: DC

## 2022-12-01 MED ORDER — DEXCOM G7 RECEIVER DEVI: 1.0000 | Freq: Every day | 1 refills | Status: DC

## 2022-12-01 NOTE — Patient Instructions (Signed)
Type 1 Diabetes Mellitus, Diagnosis, Adult  Type 1 diabetes (type 1 diabetes mellitus) is a long-term, or chronic, disease. It occurs when the cells in the pancreas (beta cells) that make a hormone called insulin are destroyed. Normally, insulin allows blood sugar, also called glucose, to enter cells in the body. The cells use glucose for energy. Lack of insulin causes excess glucose to build up in the blood instead of going into cells. As a result, high blood glucose, or hyperglycemia, develops. There is currently no cure for type 1 diabetes, but it can be managed with insulin therapy and lifestyle changes. What are the causes? The exact cause of type 1 diabetes is not known. What increases the risk? You may be more likely to develop this condition if you have a family member who has type 1 diabetes. Other factors may also make you more likely to develop type 1 diabetes, such as: Having a gene for type 1 diabetes that is passed from parent to child (inherited). Having autoimmune disorders. These are conditions in which the body's disease-fighting system (immunesystem) attacks itself. Being exposed to certain viruses. Living in an area with cold weather conditions. What are the signs or symptoms? Symptoms may develop slowly over days or weeks, or they may develop suddenly. Symptoms may include: Increased thirst or hunger. Increased urination or increased urination during the night. Sudden or unexplained weight changes. Tiredness (fatigue) or weakness. Vision changes, such as blurry vision. How is this diagnosed? This condition is diagnosed based on your symptoms, your medical history, a physical exam, and your blood glucose level. Your blood glucose may be checked with one or more of the following blood tests: A fasting blood glucose (FBG) test. You will not be allowed to eat (you will fast) for 8 hours or longer before a blood sample is taken. A random blood glucose test. This checks blood  glucose at any time of day no matter when you ate. An A1C (hemoglobin A1C) blood test. This gives information about blood glucose control over the last 2-3 months. You may be diagnosed with type 1 diabetes if: Your FBG level is 126 mg/dL (7 mmol/L) or higher. Your random blood glucose level is 200 mg/dL (11.1 mmol/L) or higher. Your hemoglobin A1C level is 6.5% or higher. These blood tests may be repeated to confirm your diagnosis. How is this treated? This condition may be managed by a specialist called an endocrinologist. You can help manage your type 1 diabetes by following instructions from your health care provider about: Taking insulin daily. This helps to keep your blood glucose levels in the healthy range. Taking medicines to help prevent complications from diabetes. These may include: Aspirin. Medicine to lower cholesterol. Medicine to control blood pressure. Checking your blood glucose as often as told. Making diet and lifestyle changes. These may include: Following an individualized nutrition plan that is developed by a registered dietitian. Getting regular exercise. Finding ways to manage stress. Your health care provider will set individualized treatment goals for you. Your goals will be based on your age, other medical conditions you have, and how you respond to diabetes treatment. In general, the goal of treatment is to maintain the following blood glucose levels: Before meals (preprandial): 80-130 mg/dL (4.4-7.2 mmol/L). After meals (postprandial): below 180 mg/dL (10 mmol/L). Hemoglobin A1C level: less than 7%. Follow these instructions at home: Questions to ask your health care provider Consider asking the following questions: Do I need to meet with a certified diabetes care and education   specialist? Should I consider joining a support group for people with diabetes? What equipment will I need to manage my diabetes at home? What diabetes medicines should I take, and  when? How often should I check my blood glucose? What number should I call if I have questions? When is my next appointment? General instructions Take over-the-counter and prescription medicines only as told by your health care provider. Keep all follow-up visits as told by your health care provider. This is important. Where to find more information American Diabetes Association (ADA): diabetes.org Association of Diabetes Care and Education Specialists (ADCES): diabeteseducator.org International Diabetes Federation (IDF): idf.org Contact a health care provider if: Your blood glucose level is higher than 240 mg/dL (13.3 mmol/L) for 2 days in a row. You have been sick or have had a fever for 2 days or more and you are not getting better. You have any of the following problems for more than 6 hours: You cannot eat or drink. You have nausea and vomiting. You have diarrhea. Get help right away if: Your blood glucose is below 54 mg/dL (3 mmol/L). You become confused or you have trouble thinking clearly. You have trouble breathing. These symptoms may represent a serious problem that is an emergency. Do not wait to see if the symptoms will go away. Get medical help right away. Call your local emergency services (911 in the [place] Do not drive yourself to the hospital. Summary Type 1 diabetes (type 1 diabetes mellitus) is a long-term, or chronic, disease. It occurs when the cells in the pancreas (beta cells) that make a hormone called insulin are destroyed. There is currently no cure for type 1 diabetes. The exact cause of type 1 diabetes is not known. This condition is treated by taking insulin and other medicines to help prevent complications from diabetes. Diet and lifestyle changes are also part of treatment. Your health care provider will set individualized treatment goals for you. Your goals will be based on your age, other medical conditions you have, and how you respond to diabetes  treatment. This information is not intended to replace advice given to you by your health care provider. Make sure you discuss any questions you have with your health care provider. Document Revised: 12/29/2019 Document Reviewed: 12/29/2019 Elsevier Patient Education  2022 Elsevier Inc.  

## 2022-12-01 NOTE — Progress Notes (Signed)
Subjective:  Patient ID: Antonio Baldwin., male    DOB: 04-Mar-1956  Age: 67 y.o. MRN: [id number]  CC: Diabetes, Anemia, Hyperlipidemia, and Hypertension   HPI [name]. presents for f/up -  He was recently admitted for DKA.  He is using Lantus 32 units a day.  He complains of fatigue but denies nausea, vomiting, abdominal pain, chest pain, shortness of breath, or edema. A recent blood sugar was down to 113.  Admit date: 11/24/2022 Discharge date: 11/25/2022 30 Day Unplanned Readmission Risk Score     Flowsheet Row ED to Hosp-Admission (Current) from 11/24/2022 in [place] [facility] Unit  30 Day Unplanned Readmission Risk Score (%) 11.65 Filed at 11/25/2022 0801           This score is the patient's risk of an unplanned readmission within 30 days of being discharged (0 -100%). The score is based on dignosis, age, lab data, medications, orders, and past utilization.   Low:  0-14.9   Medium: 15-21.9   High: 22-29.9   Extreme: 30 and above                 Admitted From: Home Disposition: Home   Recommendations for Outpatient Follow-up:  Follow up with PCP in 1-2 weeks Please obtain BMP/CBC in one week Please follow up with your PCP on the following pending results: Unresulted Labs (From admission, onward)        Start     Ordered    11/24/22 1433   Hemoglobin A1c  (Diabetes Ketoacidosis (DKA))  Once,   R       Comments: To assess prior glycemic control.     11/24/22 1433                    Home Health: None Equipment/Devices: None   Discharge Condition: Stable CODE STATUS: Full code Diet recommendation: Diabetic   Subjective: Seen and examined.  Feels better.  Ready to go home.   HPI: Antonio Baldwin. is a 67 y.o. 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   Patient ate a  salad for dinner 3 days ago and then started to have persistent nauseous vomiting diarrhea, diarrhea subsided yesterday, but continued to have nauseous and vomiting of stomach content, nonbloody none bile, unable to take any food or fluids down for last 2 days and became very dehydrated.  Wife checked her sugar yesterday several times more than 600 and routine dose of insulin given and this morning glucose was 500.     Patient had a left hip replacement about 6 months ago and since then patient has had episodes of easily to get full and had to eat small meals, and poor appetite and overall lost about 50 pounds, although family attributed to [place] however the patient has been out of [place] for 1+ month and is eating habit still remains the same.  He does admitted to have history of gastroparesis before and was treated.   ED Course: Patient was found eating in DKA with glucose 300, AKI creatinine 1.5 compared to baseline 0.7, bicarb 16 anion gap 19.   Brief/Interim Summary: Patient was admitted under hospitalist service secondary to starvation ketoacidosis and DKA, he was given significant amount of fluids, over 3 L and was started on insulin drip and subsequently his DKA resolved  and he was transition to long-acting insulin with SSI.  He also had intractable nausea and vomiting.  Did not have any abdominal pain, no abdominal imaging was done.  He also had dehydration causing AKI which resolved with IV fluids as well.  Patient is feeling better, tolerating regular diet with mild nausea but overall feeling much better.  Labs are looking good except mild hypokalemia of 3.2.  Patient feels comfortable going home.  He will be discharged after potassium will be replenished today.  He will resume all his home medications.   Discharge plan was discussed with patient and/or family member and they verbalized understanding and agreed with it.  Discharge Diagnoses:  Principal Problem:   DKA (diabetic  ketoacidosis) ([place]) Active Problems:   Essential hypertension, benign   Type II diabetes mellitus with manifestations ([place])   AKI (acute kidney injury) ([place])     Outpatient Medications Prior to Visit  Medication Sig Dispense Refill   albuterol (VENTOLIN HFA) 108 (90 Base) MCG/ACT inhaler Inhale 1-2 puffs into the lungs every 4 (four) hours as needed for wheezing or shortness of breath.     amLODipine (NORVASC) 10 MG tablet Take 10 mg by mouth daily.     aspirin EC 81 MG tablet Take 1 tablet (81 mg total) by mouth 2 (two) times daily. To be taken after surgery to prevent blood clots 84 tablet 0   docusate sodium (COLACE) 100 MG capsule Take 1 capsule (100 mg total) by mouth daily as needed. (Patient taking differently: Take 100 mg by mouth daily as needed for mild constipation or moderate constipation.) 30 capsule 2   empagliflozin (JARDIANCE) 25 MG TABS tablet Take 25 mg by mouth daily.     insulin glargine (LANTUS) 100 UNIT/ML injection Inject 0.5 mLs (50 Units total) into the skin daily. (Patient taking differently: Inject 30 Units into the skin daily.) 10 mL 11   metFORMIN (GLUCOPHAGE) 500 MG tablet Take 500 mg by mouth 2 (two) times daily with a meal.     methocarbamol (ROBAXIN) 500 MG tablet Take 1 tablet (500 mg total) by mouth 2 (two) times daily as needed. To be taken after surgery (Patient taking differently: Take 500 mg by mouth 2 (two) times daily as needed for muscle spasms. To be taken after surgery) 20 tablet 2   montelukast (SINGULAIR) 10 MG tablet Take 10 mg by mouth daily.     omeprazole (PRILOSEC) 20 MG capsule Take 20 mg by mouth daily.     ondansetron (ZOFRAN) 4 MG tablet Take 1 tablet (4 mg total) by mouth every 8 (eight) hours as needed for nausea or vomiting. 40 tablet 0   rosuvastatin (CRESTOR) 20 MG tablet Take 1 tablet (20 mg total) by mouth daily. 90 tablet 1   Semaglutide,0.25 or 0.5MG /DOS, (OZEMPIC, 0.25 OR 0.5 MG/DOSE,) 2 MG/1.5ML SOPN Inject 0.5 mg into the skin  every Sunday.     No facility-administered medications prior to visit.    ROS Review of Systems  Constitutional:  Positive for fatigue and unexpected weight change (wt loss). Negative for chills, diaphoresis and fever.  HENT: Negative.  Negative for sore throat.   Eyes: Negative.   Respiratory:  Negative for cough, chest tightness and wheezing.   Cardiovascular:  Negative for chest pain, palpitations and leg swelling.  Gastrointestinal: Negative.  Negative for abdominal pain, constipation, diarrhea, nausea and vomiting.  Endocrine: Negative.   Genitourinary: Negative.  Negative for difficulty urinating and dysuria.  Musculoskeletal: Negative.  Negative for arthralgias  and myalgias.  Skin: Negative.   Neurological: Negative.  Negative for dizziness and weakness.  Hematological:  Negative for adenopathy. Does not bruise/bleed easily.  Psychiatric/Behavioral: Negative.      Objective:  BP 126/72 (BP Location: Right Arm, Patient Position: Sitting, Cuff Size: Large)   Pulse 83   Temp 98.4 F (36.9 C) (Oral)   Ht 5\' 8"  (1.727 m)   Wt 166 lb (75.3 kg)   SpO2 95%   BMI 25.24 kg/m   BP Readings from Last 3 Encounters:  12/01/22 126/72  11/25/22 (!) 120/52  03/25/22 127/71    Wt Readings from Last 3 Encounters:  12/01/22 166 lb (75.3 kg)  11/24/22 173 lb 1 oz (78.5 kg)  03/24/22 173 lb (78.5 kg)    Physical Exam Vitals reviewed.  Constitutional:      Appearance: Normal appearance.  HENT:     Nose: Nose normal.     Mouth/Throat:     Mouth: Mucous membranes are moist.  Eyes:     General: No scleral icterus.    Conjunctiva/sclera: Conjunctivae normal.  Cardiovascular:     Rate and Rhythm: Normal rate and regular rhythm.     Heart sounds: No murmur heard.    No gallop.  Pulmonary:     Effort: Pulmonary effort is normal.     Breath sounds: No stridor. No wheezing, rhonchi or rales.  Abdominal:     General: Abdomen is flat.     Palpations: There is no mass.      Tenderness: There is no abdominal tenderness. There is no guarding or rebound.     Hernia: No hernia is present.  Musculoskeletal:        General: Normal range of motion.     Cervical back: Neck supple.     Right lower leg: No edema.     Left lower leg: No edema.  Lymphadenopathy:     Cervical: No cervical adenopathy.  Skin:    General: Skin is warm and dry.  Neurological:     General: No focal deficit present.     Mental Status: He is alert.  Psychiatric:        Mood and Affect: Mood normal.        Behavior: Behavior normal.     Lab Results  Component Value Date   WBC 9.8 11/25/2022   HGB 11.6 (L) 11/25/2022   HCT 34.2 (L) 11/25/2022   PLT 184 11/25/2022   GLUCOSE 77 11/25/2022   CHOL 93 12/01/2022   TRIG 131.0 12/01/2022   HDL 37.70 (L) 12/01/2022   LDLDIRECT 146.0 09/15/2012   LDLCALC 29 12/01/2022   ALT 14 12/01/2022   AST 12 12/01/2022   NA 139 11/25/2022   K 3.2 (L) 11/25/2022   CL 106 11/25/2022   CREATININE 0.79 11/25/2022   BUN 15 11/25/2022   CO2 22 11/25/2022   TSH 0.20 (L) 06/20/2021   PSA 0.28 12/01/2022   HGBA1C >15.5 (H) 11/24/2022   MICROALBUR 2.1 (H) 12/01/2022    DG Abd 2 Views  Result Date: 11/24/2022 CLINICAL DATA:  Nausea vomiting EXAM: ABDOMEN - 2 VIEW COMPARISON:  CT 06/08/2021 FINDINGS: No free air beneath the diaphragm. Focal gas-filled loop of small bowel measuring 3 cm maximum in the left mid abdomen. Scattered colon and rectal gas is present. Right hip replacement. No radiopaque calculi. IMPRESSION: Focal gas-filled loop of small bowel in the left mid abdomen, nonspecific, could be secondary to focal ileus or enteritis. Electronically Signed   By:  Jasmine Pang M.D.   On: 11/24/2022 17:09    Assessment & Plan:   Colten was seen today for diabetes, anemia, hyperlipidemia and hypertension.  Diagnoses and all orders for this visit:  Primary hypertension-his blood pressure is well-controlled.  Will treat the hypokalemia. -      Urinalysis, Routine w reflex microscopic; Future -     AMB Referral to Chronic Care Management Services -     Urinalysis, Routine w reflex microscopic -     potassium chloride (KLOR-CON M) 10 MEQ tablet; Take 1 tablet (10 mEq total) by mouth 2 (two) times daily.  Diabetes 1.5, managed as type 2 (HCC)-based on his report his recent blood sugar was much improved.  Will continue to adjust the basal insulin to try to achieve better glycemic control. -     Urinalysis, Routine w reflex microscopic; Future -     Microalbumin / creatinine urine ratio; Future -     Continuous Blood Gluc Sensor (DEXCOM G7 SENSOR) MISC; 1 Act by Does not apply route daily. -     Continuous Blood Gluc Receiver (DEXCOM G7 RECEIVER) DEVI; 1 Act by Does not apply route daily. -     AMB Referral to Chronic Care Management Services -     HM Diabetes Foot Exam -     Microalbumin / creatinine urine ratio -     Urinalysis, Routine w reflex microscopic -     Glucagon (GVOKE HYPOPEN 2-PACK) 1 MG/0.2ML SOAJ; Inject 1 Act into the skin daily as needed.  Hyperlipidemia with target low density lipoprotein (LDL) cholesterol less than 100 mg/dL -     Hepatic function panel; Future -     Hepatic function panel -     Lipid panel; Future  Low TSH level-will monitor his TFTs. -     Thyroid Panel With TSH; Future -     Hepatic function panel; Future -     Hepatic function panel -     Thyroid Panel With TSH  Prostate cancer screening -     PSA; Future -     PSA  Deficiency anemia- Will evaluate for vitamin deficiencies. -     IBC + Ferritin; Future -     Vitamin B12; Future -     Reticulocytes; Future -     Folate; Future -     Vitamin B1; Future -     Zinc; Future -     Zinc -     Vitamin B1 -     Folate -     Reticulocytes -     Vitamin B12 -     IBC + Ferritin  Insulin-requiring or dependent type II diabetes mellitus (HCC) -     Continuous Blood Gluc Sensor (DEXCOM G7 SENSOR) MISC; 1 Act by Does not apply route  daily. -     Continuous Blood Gluc Receiver (DEXCOM G7 RECEIVER) DEVI; 1 Act by Does not apply route daily. -     AMB Referral to Chronic Care Management Services -     Glucagon (GVOKE HYPOPEN 2-PACK) 1 MG/0.2ML SOAJ; Inject 1 Act into the skin daily as needed.  Hypokalemia -     potassium chloride (KLOR-CON M) 10 MEQ tablet; Take 1 tablet (10 mEq total) by mouth 2 (two) times daily.   I have discontinued Eshawn L. Uselton Jr.'s Ozempic (0.25 or 0.5 MG/DOSE). I am also having him start on Dexcom G7 Sensor, Dexcom G7 Receiver, Gvoke HypoPen 2-Pack, and potassium chloride.  Additionally, I am having him maintain his insulin glargine, rosuvastatin, metFORMIN, montelukast, amLODipine, empagliflozin, omeprazole, albuterol, aspirin EC, methocarbamol, docusate sodium, and ondansetron.  Meds ordered this encounter  Medications   Continuous Blood Gluc Sensor (DEXCOM G7 SENSOR) MISC    Sig: 1 Act by Does not apply route daily.    Dispense:  9 each    Refill:  1   Continuous Blood Gluc Receiver (DEXCOM G7 RECEIVER) DEVI    Sig: 1 Act by Does not apply route daily.    Dispense:  9 each    Refill:  1   Glucagon (GVOKE HYPOPEN 2-PACK) 1 MG/0.2ML SOAJ    Sig: Inject 1 Act into the skin daily as needed.    Dispense:  2 mL    Refill:  5   potassium chloride (KLOR-CON M) 10 MEQ tablet    Sig: Take 1 tablet (10 mEq total) by mouth 2 (two) times daily.    Dispense:  180 tablet    Refill:  0     Follow-up: Return in about 3 months (around 03/02/2023).  Sanda Linger, MD

## 2022-12-02 LAB — MICROALBUMIN / CREATININE URINE RATIO
Creatinine,U: 234.4 mg/dL
Microalb Creat Ratio: 0.9 mg/g (ref 0.0–30.0)
Microalb, Ur: 2.1 mg/dL — ABNORMAL HIGH (ref 0.0–1.9)

## 2022-12-02 MED ORDER — GVOKE HYPOPEN 2-PACK 1 MG/0.2ML ~~LOC~~ SOAJ: 1.0000 | Freq: Every day | SUBCUTANEOUS | 5 refills | Status: DC | PRN

## 2022-12-04 ENCOUNTER — Ambulatory Visit: Payer: Self-pay

## 2022-12-04 ENCOUNTER — Telehealth: Payer: Self-pay

## 2022-12-04 NOTE — Patient Outreach (Signed)
  Care Coordination   Initial Visit Note   12/04/2022 Name: Antonio Baldwin. MRN: [id number] DOB: December 27, 1955  Antonio Baldwin. is a 66 y.o. year old male who sees Janith Lima, MD for primary care. I spoke with  Antonio Baldwin. by phone today.  What matters to the patients health and wellness today?  Follow up call post hospitalization. Patient has attended follow up visit with primary care provider. He reports blood sugar this morning 105. He denies any high blood sugars. States he is eating better. He continues to use old glucometer for finger sticks. He has not picked up dexcom meter adding he would like to get it from the [place]. He reports he has his medications and obtains them through the [place]. Mr. Docken denies any social work needs at this time.   Goals Addressed             This Visit's Progress    COMPLETED: Care Coordination Activities       Care Coordination Interventions: Discussed health status post hospitalization Reviewed after visit summary from office visit with PCP 12/01/22 Confirmed patient has all medications prescribed. Patient denies any concerns about medications. He reports he gets his medications through the [place] Referral order noted for CCM team. Discussed CCM team referral by PCP with patient and he is agreeable to schedule appointment with CCM Nurse. RNCM collaborated with Noreene Larsson, Care Guide for the CCM team. Warm transfer to Care guide to schedule patient with CCM team Mr. Trageser informed per after visit summary prescription for Dexcom sent to [facility] on [address]. Mr. Steil states he will check with [facility] and/or contact PCP to request prescription be faxed to the [facility] or contact his [place] provider if needed.       SDOH assessments and interventions completed:  Yes  SDOH Interventions Today    Flowsheet Row Most Recent Value  SDOH Interventions   Food Insecurity Interventions Intervention Not Indicated  Housing Interventions  Intervention Not Indicated  Transportation Interventions Intervention Not Indicated  Utilities Interventions Intervention Not Indicated      Care Coordination Interventions:  Yes, provided   Follow up plan:  Patient to be followed by CCM team.    Encounter Outcome:  Pt. Visit Completed   Thea Silversmith, RN, MSN, BSN, [place] Coordinator [phone]

## 2022-12-04 NOTE — Progress Notes (Signed)
  Chronic Care Management   Note  12/04/2022 Name: Zyheir Daft. MRN: [id number] DOB: 09-Oct-1956  [name]. is a 68 y.o. year old male who is a primary care patient of Janith Lima, MD. I reached out to [name]. by phone today in response to a referral sent by Mr. Sissy Hoff Jr.'s PCP.  Mr. Hunsinger was given information about Chronic Care Management services today including:  CCM service includes personalized support from designated clinical staff supervised by the physician, including individualized plan of care and coordination with other care providers 24/7 contact phone numbers for assistance for urgent and routine care needs. Service will only be billed when office clinical staff spend 20 minutes or more in a month to coordinate care. Only one practitioner may furnish and bill the service in a calendar month. The patient may stop CCM services at amy time (effective at the end of the month) by phone call to the office staff. The patient will be responsible for cost sharing (co-pay) or up to 20% of the service fee (after annual deductible is met)  Mr. Darrall Strey.  agreedto scheduling an appointment with the CCM RN Case Manager   Follow up plan: Patient agreed to scheduled appointment with RN Case Manager on 01/06/2023(date/time).   Noreene Larsson, [place], [place] [address] Direct Dial: [phone] [email or web address]

## 2022-12-05 DIAGNOSIS — E876 Hypokalemia: Secondary | ICD-10-CM | POA: Insufficient documentation

## 2022-12-05 MED ORDER — POTASSIUM CHLORIDE CRYS ER 10 MEQ PO TBCR: 10.0000 meq | EXTENDED_RELEASE_TABLET | Freq: Two times a day (BID) | ORAL | 0 refills | Status: AC

## 2022-12-07 LAB — THYROID PANEL WITH TSH
Free Thyroxine Index: 2.5 (ref 1.4–3.8)
T3 Uptake: 32 % (ref 22–35)
T4, Total: 7.8 ug/dL (ref 4.9–10.5)
TSH: 0.27 mIU/L — ABNORMAL LOW (ref 0.40–4.50)

## 2022-12-07 LAB — RETICULOCYTES
ABS Retic: 29200 cells/uL (ref 25000–90000)
Retic Ct Pct: 0.8 %

## 2022-12-07 LAB — VITAMIN B1: Vitamin B1 (Thiamine): <6 nmol/L — ABNORMAL LOW (ref 8–30)

## 2022-12-07 LAB — ZINC: Zinc: 85 ug/dL (ref 60–130)

## 2022-12-08 ENCOUNTER — Encounter: Payer: Self-pay | Admitting: Internal Medicine

## 2022-12-08 ENCOUNTER — Other Ambulatory Visit: Payer: Self-pay | Admitting: Internal Medicine

## 2022-12-08 DIAGNOSIS — D538 Other specified nutritional anemias: Secondary | ICD-10-CM

## 2022-12-08 MED ORDER — THIAMINE HCL 100 MG PO TABS: 100.0000 mg | ORAL_TABLET | Freq: Every day | ORAL | 0 refills | Status: AC

## 2022-12-09 ENCOUNTER — Ambulatory Visit (INDEPENDENT_AMBULATORY_CARE_PROVIDER_SITE_OTHER): Payer: No Typology Code available for payment source | Admitting: Orthopaedic Surgery

## 2022-12-09 ENCOUNTER — Ambulatory Visit (INDEPENDENT_AMBULATORY_CARE_PROVIDER_SITE_OTHER): Payer: No Typology Code available for payment source

## 2022-12-09 DIAGNOSIS — Z96641 Presence of right artificial hip joint: Secondary | ICD-10-CM

## 2022-12-09 MED ORDER — AMOXICILLIN 500 MG PO CAPS: 2000.0000 mg | ORAL_CAPSULE | Freq: Once | ORAL | 6 refills | Status: AC

## 2022-12-09 NOTE — Progress Notes (Signed)
Office Visit Note   Patient: Antonio Baldwin.           Date of Birth: 31-Jul-1956           MRN: [id number] Visit Date: 12/09/2022              Requested by: Janith Lima, MD [address] [place],  [place] [address] PCP: Janith Lima, MD   Assessment & Plan: Visit Diagnoses:  1. Status post total replacement of right hip     Plan: Overall Antonio Baldwin has done very well from the surgery.  Difficult to say what is causing the pain.  Possibly pinching some scar tissue with a certain movement.  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  He does not seem to be bothered by this.  Overall he is very satisfied.  Dental prophylaxis reinforced.  Recheck in 6 months with standing AP pelvis x-rays.  Follow-Up Instructions: Return in about 6 months (around 06/09/2023).   Orders:  Orders Placed This Encounter  Procedures   XR Pelvis 1-2 Views   Meds ordered this encounter  Medications   amoxicillin (AMOXIL) 500 MG capsule    Sig: Take 4 capsules (2,000 mg total) by mouth once for 1 dose.    Dispense:  4 capsule    Refill:  6      Procedures: No procedures performed   Clinical Data: No additional findings.   Subjective: Chief Complaint  Patient presents with   Right Hip - Routine Post Op    HPI Antonio Baldwin is following up status post right total hip replacement about 8 months ago.  He is doing well overall.  Every now and then he gets a little shocking pain in the hip that does not last.  Denies any constitutional symptoms. Review of Systems  Constitutional: Negative.   HENT: Negative.    Eyes: Negative.   Respiratory: Negative.    Cardiovascular: Negative.   Gastrointestinal: Negative.   Endocrine: Negative.   Genitourinary: Negative.   Skin: Negative.   Allergic/Immunologic: Negative.   Neurological: Negative.   Hematological: Negative.   Psychiatric/Behavioral: Negative.    All other systems reviewed and are negative.    Objective: Vital  Signs: There were no vitals taken for this visit.  Physical Exam Vitals and nursing note reviewed.  Constitutional:      Appearance: He is well-developed.  Pulmonary:     Effort: Pulmonary effort is normal.  Abdominal:     Palpations: Abdomen is soft.  Skin:    General: Skin is warm.  Neurological:     Mental Status: He is alert and oriented to person, place, and time.  Psychiatric:        Behavior: Behavior normal.        Thought Content: Thought content normal.        Judgment: Judgment normal.     Ortho Exam Examination of the right hip shows a fully healed surgical scar.  He has fluid painless range of motion of the hip.  I cannot reproduce the pain. Specialty Comments:  No specialty comments available.  Imaging: XR Pelvis 1-2 Views  Result Date: 12/09/2022 Stable right total hip replacement without complications.  Slight HO formation.      PMFS History: Patient Active Problem List   Diagnosis Date Noted   Anemia due to acquired thiamine deficiency 12/08/2022   Hypokalemia 12/05/2022   Deficiency anemia 12/01/2022   DKA (diabetic ketoacidosis) ([place]) 11/24/2022   AKI (acute  kidney injury) ([facility]) 11/24/2022   Status post total replacement of right hip 03/24/2022   Diabetes 1.5, managed as type 2 ([place]) 06/23/2021   Low TSH level 06/23/2021   Primary hypertension 06/20/2021   Insulin-requiring or dependent type II diabetes mellitus ([place]) 06/20/2021   Prostate cancer screening 06/20/2021   Primary osteoarthritis of right hip 05/23/2020   Hyperlipidemia with target low density lipoprotein (LDL) cholesterol less than 100 mg/dL 04/15/2013   ED (erectile dysfunction) 09/15/2012   Past Medical History:  Diagnosis Date   Allergy    Arthritis    Asthma    Diabetes mellitus without complication (HCC)    GERD (gastroesophageal reflux disease)    HTN (hypertension)     Family History  Problem Relation Age of Onset   Arthritis Other    Hypertension Other    Diabetes  Other    Arthritis Mother    Hypertension Mother    Diabetes Mother    Heart disease Neg Hx    Early death Neg Hx    Stroke Neg Hx     Past Surgical History:  Procedure Laterality Date   TONSILLECTOMY AND ADENOIDECTOMY  11/24/1958   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   Procedure: RIGHT TOTAL HIP REPLACEMENT;  Surgeon: Leandrew Koyanagi, MD;  Location: [facility];  Service: Orthopedics;  Laterality: Right;   WISDOM TOOTH EXTRACTION     Social History   Occupational History   Not on file  Tobacco Use   Smoking status: Former    Types: Cigarettes    Quit date: 09/15/2002    Years since quitting: 20.2   Smokeless tobacco: Never  Vaping Use   Vaping Use: Never used  Substance and Sexual Activity   Alcohol use: No   Drug use: Yes    Types: Marijuana    Comment: Once or twice a week   Sexual activity: Yes    Partners: Female

## 2023-01-06 ENCOUNTER — Telehealth: Payer: Self-pay

## 2023-01-06 ENCOUNTER — Ambulatory Visit (INDEPENDENT_AMBULATORY_CARE_PROVIDER_SITE_OTHER): Payer: Medicare PPO

## 2023-01-06 ENCOUNTER — Telehealth: Payer: No Typology Code available for payment source

## 2023-01-06 DIAGNOSIS — E118 Type 2 diabetes mellitus with unspecified complications: Secondary | ICD-10-CM

## 2023-01-06 DIAGNOSIS — I10 Essential (primary) hypertension: Secondary | ICD-10-CM

## 2023-01-06 DIAGNOSIS — E139 Other specified diabetes mellitus without complications: Secondary | ICD-10-CM

## 2023-01-06 NOTE — Patient Instructions (Addendum)
Please call the care guide team at [phone] if you need to cancel or reschedule your appointment.   If you are experiencing a Mental Health or [place] or need someone to talk to, please call the Suicide and Crisis Lifeline: 988 call the [place] National Suicide Prevention Lifeline: 516 310 0040 or TTY: 470 277 5698 TTY 219 831 6038) to talk to a trained counselor call 1-800-273-TALK (toll free, 24 hour hotline) go to [facility] [place] [phone])   Following is a copy of the CCM Program Consent:  CCM service includes personalized support from designated clinical staff supervised by the physician, including individualized plan of care and coordination with other care providers 24/7 contact phone numbers for assistance for urgent and routine care needs. Service will only be billed when office clinical staff spend 20 minutes or more in a month to coordinate care. Only one practitioner may furnish and bill the service in a calendar month. The patient may stop CCM services at amy time (effective at the end of the month) by phone call to the office staff. The patient will be responsible for cost sharing (co-pay) or up to 20% of the service fee (after annual deductible is met)  Following is a copy of your full provider care plan:   Goals Addressed             This Visit's Progress    CCM Expected Outcome:  Monitor, Self-Manage and Reduce Symptoms of Diabetes       Current Barriers:  Knowledge Deficits related to the goals of A1C of <7.0 and the importance of getting DM under control to prevent the ill effects of DM on other body systems Care Coordination needs related to pharm D needs and educational needs related to managing DM in a patient with DM Chronic Disease Management support and education needs related to effective management of DM Lab Results  Component Value Date   HGBA1C >15.5 (H) 11/24/2022      Planned Interventions: Provided education to patient about basic DM disease process. The patient states that he is doing better with getting his DM under control. Review of goal of A1c <7.0. The patient states that he wants to get his DM under control. Review of the ill effects on body systems of consistently high blood sugars and A1C levels. Education and support given. ; Reviewed medications with patient and discussed importance of medication adherence. The patient is compliant with medications. The patient states the [place] provider has started him back on Ozempic .25 mg for help with getting blood sugars under control and A1C down;        Reviewed prescribed diet with patient heart healthy diet. The patient is going to work with a nutritionist from the [place]. Also will send information in the mail to the patient for education and review. The patient states his wife is monitoring his dietary restrictions and helping with dietary control; Counseled on importance of regular laboratory monitoring as prescribed;        Discussed plans with patient for ongoing care management follow up and provided patient with direct contact information for care management team;      Provided patient with written educational materials related to hypo and hyperglycemia and importance of correct treatment. Education and review. The patient states that the lowest he has seen is 124 and the highest has been 258;       Reviewed scheduled/upcoming provider appointments including: 03-05-2023 and also sees the [place] providers;  Advised patient, providing education and rationale, to check cbg when you have symptoms of low or high blood sugar and has a continuous reader  and record. Has a continuous reader. The patient states that his blood sugars since being in the hospital are a lot better. Review of goal of fasting <130 and post prandial of <180.      call provider for findings outside established parameters;       Referral made  to pharmacy team for assistance with polypharmacy and ongoing support and education for effective management of DM and medications needs ;       Review of patient status, including review of consultants reports, relevant laboratory and other test results, and medications completed;       Advised patient to discuss changes in DM and question and concerns with provider;      Screening for signs and symptoms of depression related to chronic disease state;        Assessed social determinant of health barriers;    Has an eye exam coming up on 03-10-2023 States he checks his feet daily for cuts, scratches or open areas. Had a foot exam at the [facility] recently      Symptom Management: Take medications as prescribed   Attend all scheduled provider appointments Call provider office for new concerns or questions  call the Suicide and Crisis Lifeline: 988 call the [place] National Suicide Prevention Lifeline: (614)147-0879 or TTY: 603-009-6603 TTY 205 002 0831) to talk to a trained counselor call 1-800-273-TALK (toll free, 24 hour hotline) go to [facility] [address] [place] [phone]) if experiencing a Mental Health or [place]  keep appointment with eye doctor check feet daily for cuts, sores or redness trim toenails straight across wash and dry feet carefully every day wear comfortable, cotton socks wear comfortable, well-fitting shoes  Follow Up Plan: Telephone follow up appointment with care management team member scheduled for: 03-03-2023 at 9 am       CCM Expected Outcome:  Monitor, Self-Manage, and Reduce Symptoms of Hypertension       Current Barriers:  Chronic Disease Management support and education needs related to effective management of HTN  Planned Interventions: Evaluation of current treatment plan related to hypertension self management and patient's adherence to plan as established by provider;   Provided education to  patient re: stroke prevention, s/s of heart attack and stroke; Reviewed prescribed diet heart healthy/ADA diet  Reviewed medications with patient and discussed importance of compliance. The patient states he gets all of his medications from the [place] and has no issues with medications. Will consult with pharm D for help with effective management of medications;  Discussed plans with patient for ongoing care management follow up and provided patient with direct contact information for care management team; Advised patient, providing education and rationale, to monitor blood pressure daily and record, calling PCP for findings outside established parameters;  Reviewed scheduled/upcoming provider appointments including: 03-05-2023 at 1020 am. Also sees providers at the [facility] for ongoing health and wellness needs.  Advised patient to discuss changes in blood pressures and heart health with provider; Provided education on prescribed diet heart healthy/ADA diet. Will send information in mail to the patient for review and help with dietary restrictions. The patient is also going to work with a dietician from the [place] and is expecting a call for an appointment. Education and support provided. ;  Discussed complications of poorly controlled blood pressure such as heart disease,  stroke, circulatory complications, vision complications, kidney impairment, sexual dysfunction;  Screening for signs and symptoms of depression related to chronic disease state;  Assessed social determinant of health barriers;   Symptom Management: Take medications as prescribed   Attend all scheduled provider appointments Call provider office for new concerns or questions  call the Suicide and Crisis Lifeline: 988 call the [place] National Suicide Prevention Lifeline: 516-119-8701 or TTY: 854-105-9069 TTY 947-525-7141) to talk to a trained counselor call 1-800-273-TALK (toll free, 24 hour hotline) go to [facility] [address], [place] [phone]) if experiencing a Mental Health or [place]  check blood pressure weekly learn about high blood pressure call doctor for signs and symptoms of high blood pressure develop an action plan for high blood pressure keep all doctor appointments take medications for blood pressure exactly as prescribed report new symptoms to your doctor  Follow Up Plan: Telephone follow up appointment with care management team member scheduled for: 03-03-2023 at 9 am          The patient verbalized understanding of instructions, educational materials, and care plan provided today and agreed to receive a mailed copy of patient instructions, educational materials, and care plan.   Telephone follow up appointment with care management team member scheduled for: 03-03-2023 at 9 am  Diabetes Mellitus and Sick Day Management Make a plan for sick days as part of your plan to manage your diabetes. Being sick can cause stress and loss of body fluids (dehydration). These problems can cause your level of blood sugar (glucose) to rise. Common illnesses that can cause problems for people with diabetes mellitus include colds, fever, nausea, vomiting, diarrhea, and the flu. The following information offers guidance on how to care for yourself when you are sick with an illness that lasts for about 24 hours or less. Your health care provider may also give you more specific instructions. How to manage your blood glucose while sick  Check your blood glucose every 2-4 hours, or as often as told by your health care provider. If you use insulin, take your usual dose. If your blood glucose is still too high, you may need to take an extra dose of insulin as told by your health care provider. Know your treatment goals for sick days. Your target blood glucose levels may be different when you are sick. If you use a diabetes medicine that you take by mouth (orally), keep  taking your medicines. Have a plan with your health care provider for how to take: Your oral diabetes medicines. Hormone medicines that you inject to help with your diabetes. Follow these instructions at home Check your ketones Ketones are chemicals created by the liver. If you have type 1 diabetes, check your urine for ketones every 4 hours. If you have type 2 diabetes, check your urine for ketones as often as told by your health care provider. Eating and drinking Drink enough fluid to keep your urine pale yellow. If you have a fever, vomiting, or diarrhea, you can become dehydrated faster. Do not drink alcohol, caffeine, or drinks with a lot of sugar. Follow instructions from your health care provider about what drinks to avoid. When you are sick, you need to eat some form of carbohydrates (carbs). Eat 45-50 grams (45-50 g) of carbs every 3-4 hours until you feel better. All of the food and drink choices below have about 15 g of carbs in them. Plan ahead and keep some of these on  hand so you have them if you get sick. 4-6 oz (120-177 mL) of a carbonated drink that contains sugar. This includes regular soda. Do not drink diet soda. Open the drink and let it sit at room temperature for a few minutes. This may make it easier to drink. 4 oz (120 g) regular gelatin dessert. 4 oz (120 mL) fruit juice. 4 oz (120 g) ice cream or frozen yogurt. 2 oz (60 g) sherbet. 1 slice bread or toast. 6 saltine crackers. 5 vanilla wafers. Medicines Take over-the-counter and prescription medicines only as told by your health care provider. Check medicine labels for added sugars. Some medicines may contain sugar or types of sugars that can raise your blood glucose level. Contact a health care provider if: You have been sick or have had a fever for 2 days or longer and you are not getting better. Your blood glucose is at or above 240 mg/dL (13.3 mmol/L), even after you take an extra dose of insulin. You vomit  every time you drink fluids. For more than 6 hours, you have: Nausea. Vomiting. Diarrhea. Get help right away if: You have trouble breathing. You have moderate or high levels of ketones in your urine. You have a change in how you think, feel, or act (mental status). You get symptoms of diabetic ketoacidosis. These include: Nausea. Vomiting. Excessive thirst. Excessive urination. Breath that smells fruity or sweet. Rapid breathing. Pain in the abdomen. Your blood glucose is lower than 54 mg/dL (3.0 mmol/L). You used emergency glucagon to treat low blood glucose. These symptoms may be an emergency. Get help right away. Call 911. Do not wait to see if the symptoms will go away. Do not drive yourself to the hospital. This information is not intended to replace advice given to you by your health care provider. Make sure you discuss any questions you have with your health care provider. Document Revised: 05/05/2022 Document Reviewed: 05/05/2022 Elsevier Patient Education  [place]. Diabetes Mellitus and Foot Care Diabetes, also called diabetes mellitus, may cause problems with your feet and legs because of poor blood flow (circulation). Poor circulation may make your skin: Become thinner and drier. Break more easily. Heal more slowly. Peel and crack. You may also have nerve damage (neuropathy). This can cause decreased feeling in your legs and feet. This means that you may not notice minor injuries to your feet that could lead to more serious problems. Finding and treating problems early is the best way to prevent future foot problems. How to care for your feet Foot hygiene  Wash your feet daily with warm water and mild soap. Do not use hot water. Then, pat your feet and the areas between your toes until they are fully dry. Do not soak your feet. This can dry your skin. Trim your toenails straight across. Do not dig under them or around the cuticle. File the edges of your  nails with an emery board or nail file. Apply a moisturizing lotion or petroleum jelly to the skin on your feet and to dry, brittle toenails. Use lotion that does not contain alcohol and is unscented. Do not apply lotion between your toes. Shoes and socks Wear clean socks or stockings every day. Make sure they are not too tight. Do not wear knee-high stockings. These may decrease blood flow to your legs. Wear shoes that fit well and have enough cushioning. Always look in your shoes before you put them on to be sure there are no objects inside.  To break in new shoes, wear them for just a few hours a day. This prevents injuries on your feet. Wounds, scrapes, corns, and calluses  Check your feet daily for blisters, cuts, bruises, sores, and redness. If you cannot see the bottom of your feet, use a mirror or ask someone for help. Do not cut off corns or calluses or try to remove them with medicine. If you find a minor scrape, cut, or break in the skin on your feet, keep it and the skin around it clean and dry. You may clean these areas with mild soap and water. Do not clean the area with peroxide, alcohol, or iodine. If you have a wound, scrape, corn, or callus on your foot, look at it several times a day to make sure it is healing and not infected. Check for: Redness, swelling, or pain. Fluid or blood. Warmth. Pus or a bad smell. General tips Do not cross your legs. This may decrease blood flow to your feet. Do not use heating pads or hot water bottles on your feet. They may burn your skin. If you have lost feeling in your feet or legs, you may not know this is happening until it is too late. Protect your feet from hot and cold by wearing shoes, such as at the beach or on hot pavement. Schedule a complete foot exam at least once a year or more often if you have foot problems. Report any cuts, sores, or bruises to your health care provider right away. Where to find more information American  Diabetes Association: diabetes.org Association of Diabetes Care & Education Specialists: diabeteseducator.org Contact a health care provider if: You have a condition that increases your risk of infection, and you have any cuts, sores, or bruises on your feet. You have an injury that is not healing. You have redness on your legs or feet. You feel burning or tingling in your legs or feet. You have pain or cramps in your legs and feet. Your legs or feet are numb. Your feet always feel cold. You have pain around any toenails. Get help right away if: You have a wound, scrape, corn, or callus on your foot and: You have signs of infection. You have a fever. You have a red line going up your leg. This information is not intended to replace advice given to you by your health care provider. Make sure you discuss any questions you have with your health care provider. Document Revised: 05/14/2022 Document Reviewed: 05/14/2022 Elsevier Patient Education  [place]. Diabetes Mellitus and Nutrition, Adult When you have diabetes, or diabetes mellitus, it is very important to have healthy eating habits because your blood sugar (glucose) levels are greatly affected by what you eat and drink. Eating healthy foods in the right amounts, at about the same times every day, can help you: Manage your blood glucose. Lower your risk of heart disease. Improve your blood pressure. Reach or maintain a healthy weight. What can affect my meal plan? Every person with diabetes is different, and each person has different needs for a meal plan. Your health care provider may recommend that you work with a dietitian to make a meal plan that is best for you. Your meal plan may vary depending on factors such as: The calories you need. The medicines you take. Your weight. Your blood glucose, blood pressure, and cholesterol levels. Your activity level. Other health conditions you have, such as heart or kidney  disease. How do carbohydrates affect me?  Carbohydrates, also called carbs, affect your blood glucose level more than any other type of food. Eating carbs raises the amount of glucose in your blood. It is important to know how many carbs you can safely have in each meal. This is different for every person. Your dietitian can help you calculate how many carbs you should have at each meal and for each snack. How does alcohol affect me? Alcohol can cause a decrease in blood glucose (hypoglycemia), especially if you use insulin or take certain diabetes medicines by mouth. Hypoglycemia can be a life-threatening condition. Symptoms of hypoglycemia, such as sleepiness, dizziness, and confusion, are similar to symptoms of having too much alcohol. Do not drink alcohol if: Your health care provider tells you not to drink. You are pregnant, may be pregnant, or are planning to become pregnant. If you drink alcohol: Limit how much you have to: 0-1 drink a day for women. 0-2 drinks a day for men. Know how much alcohol is in your drink. In the [place] one drink equals one 12 oz bottle of beer (355 mL), one 5 oz glass of wine (148 mL), or one 1 oz glass of hard liquor (44 mL). Keep yourself hydrated with water, diet soda, or unsweetened iced tea. Keep in mind that regular soda, juice, and other mixers may contain a lot of sugar and must be counted as carbs. What are tips for following this plan?  Reading food labels Start by checking the serving size on the Nutrition Facts label of packaged foods and drinks. The number of calories and the amount of carbs, fats, and other nutrients listed on the label are based on one serving of the item. Many items contain more than one serving per package. Check the total grams (g) of carbs in one serving. Check the number of grams of saturated fats and trans fats in one serving. Choose foods that have a low amount or none of these fats. Check the number of milligrams (mg) of  salt (sodium) in one serving. Most people should limit total sodium intake to less than 2,300 mg per day. Always check the nutrition information of foods labeled as "low-fat" or "nonfat." These foods may be higher in added sugar or refined carbs and should be avoided. Talk to your dietitian to identify your daily goals for nutrients listed on the label. Shopping Avoid buying canned, pre-made, or processed foods. These foods tend to be high in fat, sodium, and added sugar. Shop around the outside edge of the grocery store. This is where you will most often find fresh fruits and vegetables, bulk grains, fresh meats, and fresh dairy products. Cooking Use low-heat cooking methods, such as baking, instead of high-heat cooking methods, such as deep frying. Cook using healthy oils, such as olive, canola, or sunflower oil. Avoid cooking with butter, cream, or high-fat meats. Meal planning Eat meals and snacks regularly, preferably at the same times every day. Avoid going long periods of time without eating. Eat foods that are high in fiber, such as fresh fruits, vegetables, beans, and whole grains. Eat 4-6 oz (112-168 g) of lean protein each day, such as lean meat, chicken, fish, eggs, or tofu. One ounce (oz) (28 g) of lean protein is equal to: 1 oz (28 g) of meat, chicken, or fish. 1 egg.  cup (62 g) of tofu. Eat some foods each day that contain healthy fats, such as avocado, nuts, seeds, and fish. What foods should I eat? Fruits Berries. Apples. Oranges. Peaches. Apricots.  Plums. Grapes. Mangoes. Papayas. Pomegranates. Kiwi. Cherries. Vegetables Leafy greens, including lettuce, spinach, kale, chard, collard greens, mustard greens, and cabbage. Beets. Cauliflower. Broccoli. Carrots. Green beans. Tomatoes. Peppers. Onions. Cucumbers. Brussels sprouts. Grains Whole grains, such as whole-wheat or whole-grain bread, crackers, tortillas, cereal, and pasta. Unsweetened oatmeal. Quinoa. Brown or wild  rice. Meats and other proteins Seafood. Poultry without skin. Lean cuts of poultry and beef. Tofu. Nuts. Seeds. Dairy Low-fat or fat-free dairy products such as milk, yogurt, and cheese. The items listed above may not be a complete list of foods and beverages you can eat and drink. Contact a dietitian for more information. What foods should I avoid? Fruits Fruits canned with syrup. Vegetables Canned vegetables. Frozen vegetables with butter or cream sauce. Grains Refined white flour and flour products such as bread, pasta, snack foods, and cereals. Avoid all processed foods. Meats and other proteins Fatty cuts of meat. Poultry with skin. Breaded or fried meats. Processed meat. Avoid saturated fats. Dairy Full-fat yogurt, cheese, or milk. Beverages Sweetened drinks, such as soda or iced tea. The items listed above may not be a complete list of foods and beverages you should avoid. Contact a dietitian for more information. Questions to ask a health care provider Do I need to meet with a certified diabetes care and education specialist? Do I need to meet with a dietitian? What number can I call if I have questions? When are the best times to check my blood glucose? Where to find more information: American Diabetes Association: diabetes.org Academy of Nutrition and Dietetics: eatright.[employer] of Diabetes and Digestive and Kidney Diseases: [employer] Association of Diabetes Care & Education Specialists: diabeteseducator.org Summary It is important to have healthy eating habits because your blood sugar (glucose) levels are greatly affected by what you eat and drink. It is important to use alcohol carefully. A healthy meal plan will help you manage your blood glucose and lower your risk of heart disease. Your health care provider may recommend that you work with a dietitian to make a meal plan that is best for you. This information is not intended to replace advice given  to you by your health care provider. Make sure you discuss any questions you have with your health care provider. Document Revised: 06/13/2020 Document Reviewed: 06/13/2020 Elsevier Patient Education  [place]. Hyperglycemia Hyperglycemia is when the sugar (glucose) level in your blood is too high. High blood sugar can happen to people who have or do not have diabetes. High blood sugar can happen quickly. It can be an emergency. What are the causes? If you have diabetes, high blood sugar may be caused by: Medicines that increase blood sugar or affect your control of diabetes. Getting less physical activity. Overeating. Being sick or injured or having an infection. Having surgery. Stress. Not giving yourself enough insulin (if you are taking it). You may have high blood sugar because you have diabetes that has not been diagnosed yet. If you do not have diabetes, high blood sugar may be caused by: Certain medicines. Stress. A bad illness. An infection. Having surgery. Diseases of the pancreas. What increases the risk? This condition is more likely to develop in people who have risk factors for diabetes, such as: Having a family member with diabetes. Certain conditions in which the body's defense system (immune system) attacks itself. These are called autoimmune disorders. Being overweight. Not being active. Having a condition called insulin resistance. Having a history of: Prediabetes. Diabetes when pregnant. Polycystic ovarian syndrome (  PCOS). What are the signs or symptoms? This condition may not cause symptoms. If you do have symptoms, they may include: Feeling more thirsty than normal. Needing to pee (urinate) more often than normal. Hunger. Feeling very tired. Blurry eyesight (vision). You may get other symptoms as the condition gets worse, such as: Dry mouth. Pain in your belly (abdomen). Not being hungry (loss of appetite). Breath that smells  fruity. Weakness. Weight loss that is not planned. A tingling or numb feeling in your hands or feet. A headache. Cuts or bruises that heal slowly. How is this treated? Treatment depends on the cause of your condition. Treatment may include: Taking medicine to control your blood sugar levels. Changing your medicine or dosage if you take insulin or other diabetes medicines. Lifestyle changes. These may include: Exercising more. Eating healthier foods. Losing weight. Treating an illness or infection. Checking your blood sugar more often. Stopping or reducing steroid medicines. If your condition gets very bad, you will need to be treated in the hospital. Follow these instructions at home: General instructions Take over-the-counter and prescription medicines only as told by your doctor. Do not smoke or use any products that contain nicotine or tobacco. If you need help quitting, ask your doctor. If you drink alcohol: Limit how much you have to: 0-1 drink a day for women who are not pregnant. 0-2 drinks a day for men. Know how much alcohol is in a drink. In the [place] one drink equals one 12 oz bottle of beer (355 mL), one 5 oz glass of wine (148 mL), or one 1 oz glass of hard liquor (44 mL). Manage stress. If you need help with this, ask your doctor. Do exercises as told by your doctor. Keep all follow-up visits. Eating and drinking  Stay at a healthy weight. Make sure you drink enough fluid when you: Exercise. Get sick. Are in hot temperatures. Drink enough fluid to keep your pee (urine) pale yellow. If you have diabetes:  Know the symptoms of high blood sugar. Follow your diabetes management plan as told by your doctor. Make sure you: Take insulin and medicines as told. Follow your exercise plan. Follow your meal plan. Eat on time. Do not skip meals. Check your blood sugar as often as told. Make sure you check before and after exercise. If you exercise longer or in a  different way, check your blood sugar more often. Follow your sick day plan whenever you cannot eat or drink normally. Make this plan ahead of time with your doctor. Share your diabetes management plan with people in your workplace, school, and household. Check your pee for ketones when you are ill and as told by your doctor. Carry a card or wear jewelry that says that you have diabetes. Where to find more information American Diabetes Association: www.diabetes.org Contact a doctor if: Your blood sugar level is at or above 240 mg/dL (13.3 mmol/L) for 2 days in a row. You have problems keeping your blood sugar in your target range. You have high blood pressure often. You have signs of illness, such as: Feeling like you may vomit (feeling nauseous). Vomiting. A fever. Get help right away if: Your blood sugar monitor reads "high" even when you are taking insulin. You have trouble breathing. You have a change in how you think, feel, or act (mental status). You feel like you may vomit, and the feeling does not go away. You cannot stop vomiting. These symptoms may be an emergency. Get medical help  right away. Call your local emergency services (911 in the [place] Do not wait to see if the symptoms will go away. Do not drive yourself to the hospital. Summary Hyperglycemia is when the sugar (glucose) level in your blood is too high. High blood sugar can happen to people who have or do not have diabetes. Make sure you drink enough fluids and follow your meal plan. Exercise as often as told by your doctor. Contact your doctor if you have problems keeping your blood sugar in your target range. This information is not intended to replace advice given to you by your health care provider. Make sure you discuss any questions you have with your health care provider. Document Revised: 08/24/2020 Document Reviewed: 08/24/2020 Elsevier Patient Education  [place]. Hypoglycemia Hypoglycemia is  when the sugar (glucose) level in your blood is too low. Low blood sugar can happen to people who have diabetes and people who do not have diabetes. Low blood sugar can happen quickly, and it can be an emergency. What are the causes? This condition happens most often in people who have diabetes. It may be caused by: Diabetes medicine. Not eating enough, or not eating often enough. Doing more physical activity. Drinking alcohol on an empty stomach. If you do not have diabetes, this condition may be caused by: A tumor in the pancreas. Not eating enough, or not eating for long periods at a time (fasting). A very bad infection or illness. Problems after having weight loss (bariatric) surgery. Kidney failure or liver failure. Certain medicines. What increases the risk? This condition is more likely to develop in people who: Have diabetes and take medicines to lower their blood sugar. Abuse alcohol. Have a very bad illness. What are the signs or symptoms? Mild Hunger. Sweating and feeling clammy. Feeling dizzy or light-headed. Being sleepy or having trouble sleeping. Feeling like you may vomit (nauseous). A fast heartbeat. A headache. Blurry vision. Mood changes, such as: Being grouchy. Feeling worried or nervous (anxious). Tingling or loss of feeling (numbness) around your mouth, lips, or tongue. Moderate Confusion and poor judgment. Behavior changes. Weakness. Uneven heartbeat. Trouble with moving (coordination). Very low Very low blood sugar (severe hypoglycemia) is a medical emergency. It can cause: Fainting. Seizures. Loss of consciousness (coma). Death. How is this treated? Treating low blood sugar Low blood sugar is often treated by eating or drinking something that has sugar in it right away. The food or drink should contain 15 grams of a fast-acting carb (carbohydrate). Options include: 4 oz (120 mL) of fruit juice. 4 oz (120 mL) of regular soda (not diet soda). A  few pieces of hard candy. Check food labels to see how many pieces to eat for 15 grams. 1 Tbsp (15 mL) of sugar or honey. 4 glucose tablets. 1 tube of glucose gel. Treating low blood sugar if you have diabetes If you can think clearly and swallow safely, follow the 15:15 rule: Take 15 grams of a fast-acting carb. Talk with your doctor about how much you should take. Always keep a source of fast-acting carb with you, such as: Glucose tablets (take 4 tablets). A few pieces of hard candy. Check food labels to see how many pieces to eat for 15 grams. 4 oz (120 mL) of fruit juice. 4 oz (120 mL) of regular soda (not diet soda). 1 Tbsp (15 mL) of honey or sugar. 1 tube of glucose gel. Check your blood sugar 15 minutes after you take the carb. If your  blood sugar is still at or below 70 mg/dL (3.9 mmol/L), take 15 grams of a carb again. If your blood sugar does not go above 70 mg/dL (3.9 mmol/L) after 3 tries, get help right away. After your blood sugar goes back to normal, eat a meal or a snack within 1 hour.  Treating very low blood sugar If your blood sugar is below 54 mg/dL (3 mmol/L), you have very low blood sugar, or severe hypoglycemia. This is an emergency. Get medical help right away. If you have very low blood sugar and you cannot eat or drink, you will need to be given a hormone called glucagon. A family member or friend should learn how to check your blood sugar and how to give you glucagon. Ask your doctor if you need to have an emergency glucagon kit at home. Very low blood sugar may also need to be treated in a hospital. Follow these instructions at home: General instructions Take over-the-counter and prescription medicines only as told by your doctor. Stay aware of your blood sugar as told by your doctor. If you drink alcohol: Limit how much you have to: 0-1 drink a day for women who are not pregnant. 0-2 drinks a day for men. Know how much alcohol is in your drink. In the  [place] one drink equals one 12 oz bottle of beer (355 mL), one 5 oz glass of wine (148 mL), or one 1 oz glass of hard liquor (44 mL). Be sure to eat food when you drink alcohol. Know that your body absorbs alcohol quickly. This may lead to low blood sugar later. Be sure to keep checking your blood sugar. Keep all follow-up visits. If you have diabetes:  Always have a fast-acting carb (15 grams) with you to treat low blood sugar. Follow your diabetes care plan as told by your doctor. Make sure you: Know the symptoms of low blood sugar. Check your blood sugar as often as told. Always check it before and after exercise. Always check your blood sugar before you drive. Take your medicines as told. Follow your meal plan. Eat on time. Do not skip meals. Share your diabetes care plan with: Your work or school. People you live with. Carry a card or wear jewelry that says you have diabetes. Where to find more information American Diabetes Association: www.diabetes.org Contact a doctor if: You have trouble keeping your blood sugar in your target range. You have low blood sugar often. Get help right away if: You still have symptoms after you eat or drink something that contains 15 grams of fast-acting carb, and you cannot get your blood sugar above 70 mg/dL by following the 15:15 rule. Your blood sugar is below 54 mg/dL (3 mmol/L). You have a seizure. You faint. These symptoms may be an emergency. Get help right away. Call your local emergency services (911 in the [place] Do not wait to see if the symptoms will go away. Do not drive yourself to the hospital. Summary Hypoglycemia happens when the level of sugar (glucose) in your blood is too low. Low blood sugar can happen to people who have diabetes and people who do not have diabetes. Low blood sugar can happen quickly, and it can be an emergency. Make sure you know the symptoms of low blood sugar and know how to treat it. Always keep a source  of sugar (fast-acting carb) with you to treat low blood sugar. This information is not intended to replace advice given to you by your  health care provider. Make sure you discuss any questions you have with your health care provider. Document Revised: 10/11/2020 Document Reviewed: 10/11/2020 Elsevier Patient Education  [place]. How to Increase Your Level of Physical Activity Getting regular physical activity is important for your overall health and well-being. Most people do not get enough exercise. There are easy ways to increase your level of physical activity, even if you have not been very active in the past or if you are just starting out. What are the benefits of physical activity? Physical activity has many short-term and long-term benefits. Being active on a regular basis can improve your physical and mental health as well as provide other benefits. Physical health benefits Helping you lose weight or maintain a healthy weight. Strengthening your muscles and bones. Reducing your risk of certain long-term (chronic) diseases, including heart disease, cancer, and diabetes. Being able to move around more easily and for longer periods of time without getting tired (increased endurance or stamina). Improving your ability to fight off illness (enhanced immunity). Being able to sleep better. Helping you stay healthy as you get older, including: Helping you stay mobile, or capable of walking and moving around. Preventing accidents, such as falls. Increasing life expectancy. Mental health benefits Boosting your mood and improving your self-esteem. Lowering your chance of having mental health problems, such as depression or anxiety. Helping you feel good about your body. Other benefits Finding new sources of fun and enjoyment. Meeting new people who share a common interest. Before you begin If you have a chronic illness or have not been active for a while, check with your health  care provider about how to get started. Ask your health care provider what activities are safe for you. Start out slowly. Walking or doing some simple chair exercises is a good place to start, especially if you have not been active before or for a long time. Set goals that you can work toward. Ask your health care provider how much exercise is best for you. In general, most adults should: Do moderate-intensity exercise for at least 150 minutes each week (30 minutes on most days of the week) or vigorous exercise for at least 75 minutes each week, or a combination of these. Moderate-intensity exercise can include walking at a quick pace, biking, yoga, water aerobics, or gardening. Vigorous exercise involves activities that take more effort, such as jogging or running, playing sports, swimming laps, or jumping rope. Do strength exercises on at least 2 days each week. This can include weight lifting, body weight exercises, and resistance-band exercises. How to be more physically active Make a plan  Try to find activities that you enjoy. You are more likely to commit to an exercise routine if it does not feel like a chore. If you have bone or joint problems, choose low-impact exercises, like walking or swimming. Use these tips for being successful with an exercise plan: Find a workout partner for accountability. Join a group or class, such as an aerobics class, cycling class, or sports team. Make family time active. Go for a walk, bike, or swim. Include a variety of exercises each week. Consider using a fitness tracker, such as a mobile phone app or a device worn like a watch, that will count the number of steps you take each day. Many people strive to reach 10,000 steps a day. Find ways to be active in your daily routines Besides your formal exercise plans, you can find ways to do physical activity during  your daily routines, such as: Walking or biking to work or to the store. Taking the stairs  instead of the elevator. Parking farther away from the door at work or at the store. Planning walking meetings. Walking around while you are on the phone. Where to find more information Centers for Disease Control and Prevention: WorkDashboard.es [employer]'s Council on Fitness, Sports & Nutrition: www.fitness.gov ChooseMyPlate: MassVoice.es Contact a health care provider if: You have headaches, muscle aches, or joint pain that is concerning. You feel dizzy or light-headed while exercising. You faint. You feel your heart skipping, racing, or fluttering. You have chest pain while exercising. Summary Exercise benefits your mind and body at any age, even if you are just starting out. If you have a chronic illness or have not been active for a while, check with your health care provider before increasing your physical activity. Choose activities that are safe and enjoyable for you. Ask your health care provider what activities are safe for you. Start slowly. Tell your health care provider if you have problems as you start to increase your activity level. This information is not intended to replace advice given to you by your health care provider. Make sure you discuss any questions you have with your health care provider. Document Revised: 03/08/2021 Document Reviewed: 03/08/2021 Elsevier Patient Education  [place].

## 2023-01-06 NOTE — Progress Notes (Signed)
   Care Guide Note  01/06/2023 Name: Tyee Vandevoorde. MRN: [id number] DOB: [birth year]  Referred by: Janith Lima, MD Reason for referral : Care Coordination (Outreach to schedule with Pharm d per [facility] )   Antonio Baldwin. is a 67 y.o. year old male who is a primary care patient of Janith Lima, MD. Antonio Baldwin. was referred to the pharmacist for assistance related to DM.    Successful contact was made with the patient to discuss pharmacy services. Patient declines engagement at this time. Contact information was provided to the patient should they wish to reach out for assistance at a later time.  Noreene Larsson, [place], [place] [address] Direct Dial: [phone] [email or web address]

## 2023-01-06 NOTE — Chronic Care Management (AMB) (Signed)
Chronic Care Management   CCM RN Visit Note  01/06/2023 Name: Antonio Baldwin. MRN: [id number] DOB: 07-May-1956  Subjective: Antonio Baldwin. is a 67 y.o. year old male who is a primary care patient of Antonio Lima, MD. The patient was referred to the Chronic Care Management team for assistance with care management needs subsequent to provider initiation of CCM services and plan of care.    Today's Visit:  Engaged with patient by telephone for initial visit.     SDOH Interventions Today    Flowsheet Row Most Recent Value  SDOH Interventions   Food Insecurity Interventions Intervention Not Indicated  Housing Interventions Intervention Not Indicated  Transportation Interventions Intervention Not Indicated  Utilities Interventions Intervention Not Indicated  Alcohol Usage Interventions Intervention Not Indicated (Score <7)  Financial Strain Interventions Intervention Not Indicated  Physical Activity Interventions Other (Comments)  [no structured activity- encouraged activity]  Stress Interventions Intervention Not Indicated  Social Connections Interventions Intervention Not Indicated, Other (Comment)  [the patient has a good support system with his family and friends]         Goals Addressed             This Visit's Progress    CCM Expected Outcome:  Monitor, Self-Manage and Reduce Symptoms of Diabetes       Current Barriers:  Knowledge Deficits related to the goals of A1C of <7.0 and the importance of getting DM under control to prevent the ill effects of DM on other body systems Care Coordination needs related to pharm D needs and educational needs related to managing DM in a patient with DM Chronic Disease Management support and education needs related to effective management of DM Lab Results  Component Value Date   HGBA1C >15.5 (H) 11/24/2022     Planned Interventions: Provided education to patient about basic DM disease process. The patient states that he  is doing better with getting his DM under control. Review of goal of A1c <7.0. The patient states that he wants to get his DM under control. Review of the ill effects on body systems of consistently high blood sugars and A1C levels. Education and support given. ; Reviewed medications with patient and discussed importance of medication adherence. The patient is compliant with medications. The patient states the [place] provider has started him back on Ozempic .25 mg for help with getting blood sugars under control and A1C down;        Reviewed prescribed diet with patient heart healthy diet. The patient is going to work with a nutritionist from the [place]. Also will send information in the mail to the patient for education and review. The patient states his wife is monitoring his dietary restrictions and helping with dietary control; Counseled on importance of regular laboratory monitoring as prescribed;        Discussed plans with patient for ongoing care management follow up and provided patient with direct contact information for care management team;      Provided patient with written educational materials related to hypo and hyperglycemia and importance of correct treatment. Education and review. The patient states that the lowest he has seen is 124 and the highest has been 258;       Reviewed scheduled/upcoming provider appointments including: 03-05-2023 and also sees the [place] providers;         Advised patient, providing education and rationale, to check cbg when you have symptoms of low or high blood sugar and has a continuous  reader  and record. Has a continuous reader. The patient states that his blood sugars since being in the hospital are a lot better. Review of goal of fasting <130 and post prandial of <180.      call provider for findings outside established parameters;       Referral made to pharmacy team for assistance with polypharmacy and ongoing support and education for effective management of DM and  medications needs ;       Review of patient status, including review of consultants reports, relevant laboratory and other test results, and medications completed;       Advised patient to discuss changes in DM and question and concerns with provider;      Screening for signs and symptoms of depression related to chronic disease state;        Assessed social determinant of health barriers;    Has an eye exam coming up on 03-10-2023 States he checks his feet daily for cuts, scratches or open areas. Had a foot exam at the [facility] recently      Symptom Management: Take medications as prescribed   Attend all scheduled provider appointments Call provider office for new concerns or questions  call the Suicide and Crisis Lifeline: 988 call the [place] National Suicide Prevention Lifeline: 3022009769 or TTY: 602-004-8906 TTY 402-026-9186) to talk to a trained counselor call 1-800-273-TALK (toll free, 24 hour hotline) go to [facility] [address] [place] [phone]) if experiencing a Mental Health or [place]  keep appointment with eye doctor check feet daily for cuts, sores or redness trim toenails straight across wash and dry feet carefully every day wear comfortable, cotton socks wear comfortable, well-fitting shoes  Follow Up Plan: Telephone follow up appointment with care management team member scheduled for: 03-03-2023 at 9 am       CCM Expected Outcome:  Monitor, Self-Manage, and Reduce Symptoms of Hypertension       Current Barriers:  Chronic Disease Management support and education needs related to effective management of HTN  Planned Interventions: Evaluation of current treatment plan related to hypertension self management and patient's adherence to plan as established by provider;   Provided education to patient re: stroke prevention, s/s of heart attack and stroke; Reviewed prescribed diet heart healthy/ADA diet  Reviewed  medications with patient and discussed importance of compliance. The patient states he gets all of his medications from the [place] and has no issues with medications. Will consult with pharm D for help with effective management of medications;  Discussed plans with patient for ongoing care management follow up and provided patient with direct contact information for care management team; Advised patient, providing education and rationale, to monitor blood pressure daily and record, calling PCP for findings outside established parameters;  Reviewed scheduled/upcoming provider appointments including: 03-05-2023 at 1020 am. Also sees providers at the [facility] for ongoing health and wellness needs.  Advised patient to discuss changes in blood pressures and heart health with provider; Provided education on prescribed diet heart healthy/ADA diet. Will send information in mail to the patient for review and help with dietary restrictions. The patient is also going to work with a dietician from the [place] and is expecting a call for an appointment. Education and support provided. ;  Discussed complications of poorly controlled blood pressure such as heart disease, stroke, circulatory complications, vision complications, kidney impairment, sexual dysfunction;  Screening for signs and symptoms of depression related to chronic disease state;  Assessed social determinant of health barriers;   Symptom Management: Take medications as prescribed   Attend all scheduled provider appointments Call provider office for new concerns or questions  call the Suicide and Crisis Lifeline: 988 call the [place] National Suicide Prevention Lifeline: (858)380-2499 or TTY: 252-703-7398 TTY (323) 532-4777) to talk to a trained counselor call 1-800-273-TALK (toll free, 24 hour hotline) go to [facility] [address] [place] [phone]) if experiencing a [place] or [place]  check  blood pressure weekly learn about high blood pressure call doctor for signs and symptoms of high blood pressure develop an action plan for high blood pressure keep all doctor appointments take medications for blood pressure exactly as prescribed report new symptoms to your doctor  Follow Up Plan: Telephone follow up appointment with care management team member scheduled for: 03-03-2023 at 9 am          Plan:Telephone follow up appointment with care management team member scheduled for:  03-03-2023 at 9 am  [place], MSN, CCM RN Care Manager  Chronic Care Management Direct Number: [phone]

## 2023-01-06 NOTE — Plan of Care (Signed)
Chronic Care Management Provider Comprehensive Care Plan    01/06/2023 Name: Antonio Baldwin. MRN: [id number] DOB: 11-15-1956  Referral to Chronic Care Management (CCM) services was placed by Provider:  Dr. Scarlette Calico on Date: 12-01-2022.  Chronic Condition 1: DM Provider Assessment and Plan Urinalysis, Routine w reflex microscopic; Future -     Microalbumin / creatinine urine ratio; Future -     Continuous Blood Gluc Sensor (DEXCOM G7 SENSOR) MISC; 1 Act by Does not apply route daily. -     Continuous Blood Gluc Receiver ([place]) [place]; 1 Act by Does not apply route daily. -     AMB Referral to Chronic Care Management Services -     HM Diabetes Foot Exam -     Microalbumin / creatinine urine ratio -     Urinalysis, Routine w reflex microscopic -     Glucagon (GVOKE HYPOPEN 2-PACK) 1 MG/0.2ML SOAJ; Inject 1 Act into the skin daily as needed.   Expected Outcome/Goals Addressed This Visit (Provider CCM goals/Provider Assessment and plan   CCM (DIABETES) EXPECTED OUTCOME: MONITOR, SELF-MANAGE AND REDUCE SYMPTOMS OF DIABETES   Symptom Management Condition 1: Take all medications as prescribed Attend all scheduled provider appointments Call provider office for new concerns or questions  call the Suicide and Crisis Lifeline: 988 call the [place] National Suicide Prevention Lifeline: 218 876 1435 or TTY: 657-435-0486 TTY (269)804-8635) to talk to a trained counselor call 1-800-273-TALK (toll free, 24 hour hotline) go to [facility] [address] [place] [phone]) if experiencing a Mental Health or [place]  keep appointment with eye doctor check feet daily for cuts, sores or redness trim toenails straight across wash and dry feet carefully every day wear comfortable, cotton socks wear comfortable, well-fitting shoes  Chronic Condition 2: HTN Provider Assessment and Plan Urinalysis, Routine w reflex  microscopic; Future -     AMB Referral to Chronic Care Management Services -     Urinalysis, Routine w reflex microscopic -     potassium chloride (KLOR-CON M) 10 MEQ tablet; Take 1 tablet (10 mEq total) by mouth 2 (two) times daily.     Expected Outcome/Goals Addressed This Visit (Provider CCM goals/Provider Assessment and plan   CCM (HYPERTENSION)  EXPECTED OUTCOME:  MONITOR,SELF- MANAGE AND REDUCE SYMPTOMS OF HYPERTENSION  Symptom Management Condition 2: Take all medications as prescribed Attend all scheduled provider appointments Call provider office for new concerns or questions  call the Suicide and Crisis Lifeline: 988 call the [place] National Suicide Prevention Lifeline: 479-354-1435 or TTY: (585)520-6989 TTY [phone]) to talk to a trained counselor call 1-800-273-TALK (toll free, 24 hour hotline) go to [facility] [address], [place] [phone]) if experiencing a Mental Health or [place]  check blood pressure weekly learn about high blood pressure take blood pressure log to all doctor appointments call doctor for signs and symptoms of high blood pressure keep all doctor appointments take medications for blood pressure exactly as prescribed report new symptoms to your doctor  Problem List Patient Active Problem List   Diagnosis Date Noted   Anemia due to acquired thiamine deficiency 12/08/2022   Hypokalemia 12/05/2022   Deficiency anemia 12/01/2022   DKA (diabetic ketoacidosis) ([place]) 11/24/2022   AKI (acute kidney injury) ([place]) 11/24/2022   Status post total replacement of right hip 03/24/2022   Diabetes 1.5, 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   Low TSH level 06/23/2021   Primary  hypertension 06/20/2021   Insulin-requiring or dependent type II diabetes mellitus ([place]) 06/20/2021   Prostate cancer screening 06/20/2021   Primary osteoarthritis of right hip 05/23/2020   Hyperlipidemia with target low  density lipoprotein (LDL) cholesterol less than 100 mg/dL 04/15/2013   ED (erectile dysfunction) 09/15/2012    Medication Management  Current Outpatient Medications:    albuterol (VENTOLIN HFA) 108 (90 Base) MCG/ACT inhaler, Inhale 1-2 puffs into the lungs every 4 (four) hours as needed for wheezing or shortness of breath., Disp: , Rfl:    amLODipine (NORVASC) 10 MG tablet, Take 10 mg by mouth daily., Disp: , Rfl:    aspirin EC 81 MG tablet, Take 1 tablet (81 mg total) by mouth 2 (two) times daily. To be taken after surgery to prevent blood clots, Disp: 84 tablet, Rfl: 0   Continuous Blood Gluc Receiver ([place]) DEVI, 1 Act by Does not apply route daily., Disp: 9 each, Rfl: 1   Continuous Blood Gluc Sensor (DEXCOM G7 SENSOR) MISC, 1 Act by Does not apply route daily., Disp: 9 each, Rfl: 1   docusate sodium (COLACE) 100 MG capsule, Take 1 capsule (100 mg total) by mouth daily as needed. (Patient taking differently: Take 100 mg by mouth daily as needed for mild constipation or moderate constipation.), Disp: 30 capsule, Rfl: 2   empagliflozin (JARDIANCE) 25 MG TABS tablet, Take 25 mg by mouth daily., Disp: , Rfl:    Glucagon (GVOKE HYPOPEN 2-PACK) 1 MG/0.2ML SOAJ, Inject 1 Act into the skin daily as needed., Disp: 2 mL, Rfl: 5   insulin glargine (LANTUS) 100 UNIT/ML injection, Inject 0.5 mLs (50 Units total) into the skin daily. (Patient taking differently: Inject 30 Units into the skin daily.), Disp: 10 mL, Rfl: 11   metFORMIN (GLUCOPHAGE) 500 MG tablet, Take 500 mg by mouth 2 (two) times daily with a meal., Disp: , Rfl:    methocarbamol (ROBAXIN) 500 MG tablet, Take 1 tablet (500 mg total) by mouth 2 (two) times daily as needed. To be taken after surgery (Patient taking differently: Take 500 mg by mouth 2 (two) times daily as needed for muscle spasms. To be taken after surgery), Disp: 20 tablet, Rfl: 2   montelukast (SINGULAIR) 10 MG tablet, Take 10 mg by mouth daily., Disp: , Rfl:     omeprazole (PRILOSEC) 20 MG capsule, Take 20 mg by mouth daily., Disp: , Rfl:    ondansetron (ZOFRAN) 4 MG tablet, Take 1 tablet (4 mg total) by mouth every 8 (eight) hours as needed for nausea or vomiting., Disp: 40 tablet, Rfl: 0   potassium chloride (KLOR-CON M) 10 MEQ tablet, Take 1 tablet (10 mEq total) by mouth 2 (two) times daily., Disp: 180 tablet, Rfl: 0   rosuvastatin (CRESTOR) 20 MG tablet, Take 1 tablet (20 mg total) by mouth daily., Disp: 90 tablet, Rfl: 1   thiamine (VITAMIN B1) 100 MG tablet, Take 1 tablet (100 mg total) by mouth daily., Disp: 90 tablet, Rfl: 0  Cognitive Assessment Identity Confirmed: : Name; DOB Cognitive Status: Normal Other:  : Entirety of call today completed with patient and his spouse Antonio Baldwin on [facility] DPR   Functional Assessment Hearing Difficulty or Deaf: no Wear Glasses or Blind: yes Vision Management: wears glasses Concentrating, Remembering or Making Decisions Difficulty (CP): no Difficulty Communicating: no Difficulty Eating/Swallowing: no Walking or Climbing Stairs Difficulty: no Dressing/Bathing Difficulty: no Doing Errands Independently Difficulty (such as shopping) (CP): no Change in Functional Status Since Onset of Current Illness/Injury: no  Caregiver Assessment  Primary Source of Support/Comfort: spouse Name of Support/Comfort Primary Source: Antonio Baldwin- wife People in Home: spouse Family Caregiver if Needed: spouse Family Caregiver Names: Antonio Baldwin if needed Primary Roles/Responsibilities: retired   Planned Interventions  Provided education to patient about basic DM disease process. The patient states that he is doing better with getting his DM under control. Review of goal of A1c <7.0. The patient states that he wants to get his DM under control. Review of the ill effects on body systems of consistently high blood sugars and A1C levels. Education and support given. ; Reviewed medications with patient and discussed importance  of medication adherence. The patient is compliant with medications. The patient states the [place] provider has started him back on Ozempic .25 mg for help with getting blood sugars under control and A1C down;        Reviewed prescribed diet with patient heart healthy diet. The patient is going to work with a nutritionist from the [place]. Also will send information in the mail to the patient for education and review. The patient states his wife is monitoring his dietary restrictions and helping with dietary control; Counseled on importance of regular laboratory monitoring as prescribed;        Discussed plans with patient for ongoing care management follow up and provided patient with direct contact information for care management team;      Provided patient with written educational materials related to hypo and hyperglycemia and importance of correct treatment. Education and review. The patient states that the lowest he has seen is 124 and the highest has been 258;       Reviewed scheduled/upcoming provider appointments including: 03-05-2023 and also sees the [place] providers;         Advised patient, providing education and rationale, to check cbg when you have symptoms of low or high blood sugar and has a continuous reader  and record. Has a continuous reader. The patient states that his blood sugars since being in the hospital are a lot better. Review of goal of fasting <130 and post prandial of <180.      call provider for findings outside established parameters;       Referral made to pharmacy team for assistance with polypharmacy and ongoing support and education for effective management of DM and medications needs ;       Review of patient status, including review of consultants reports, relevant laboratory and other test results, and medications completed;       Advised patient to discuss changes in DM and question and concerns with provider;      Screening for signs and symptoms of depression related to  chronic disease state;        Assessed social determinant of health barriers;    Has an eye exam coming up on 03-10-2023 States he checks his feet daily for cuts, scratches or open areas. Had a foot exam at the [facility] recently     Evaluation of current treatment plan related to hypertension self management and patient's adherence to plan as established by provider;   Provided education to patient re: stroke prevention, s/s of heart attack and stroke; Reviewed prescribed diet heart healthy/ADA diet  Reviewed medications with patient and discussed importance of compliance. The patient states he gets all of his medications from the [place] and has no issues with medications. Will consult with pharm D for help with effective management of medications;  Discussed plans with patient for ongoing care management  follow up and provided patient with direct contact information for care management team; Advised patient, providing education and rationale, to monitor blood pressure daily and record, calling PCP for findings outside established parameters;  Reviewed scheduled/upcoming provider appointments including: 03-05-2023 at 1020 am. Also sees providers at the [facility] for ongoing health and wellness needs.  Advised patient to discuss changes in blood pressures and heart health with provider; Provided education on prescribed diet heart healthy/ADA diet. Will send information in mail to the patient for review and help with dietary restrictions. The patient is also going to work with a dietician from the [place] and is expecting a call for an appointment. Education and support provided. ;  Discussed complications of poorly controlled blood pressure such as heart disease, stroke, circulatory complications, vision complications, kidney impairment, sexual dysfunction;  Screening for signs and symptoms of depression related to chronic disease state;  Assessed social determinant of health barriers;     Interaction and coordination  with outside resources, practitioners, and providers See CCM Referral  Care Plan: Printed and mailed to patient

## 2023-01-22 DIAGNOSIS — E1159 Type 2 diabetes mellitus with other circulatory complications: Secondary | ICD-10-CM

## 2023-01-22 DIAGNOSIS — I10 Essential (primary) hypertension: Secondary | ICD-10-CM | POA: Diagnosis not present

## 2023-03-02 ENCOUNTER — Telehealth: Payer: Self-pay

## 2023-03-02 NOTE — Progress Notes (Signed)
  Chronic Care Management Note  03/02/2023 Name: Antonio Baldwin. MRN: [id number] DOB: 1956-05-12  Antonio Bras. is a 67 y.o. year old male who is a primary care patient of Etta Grandchild, MD and is actively engaged with the Chronic Care Management team. I reached out to Antonio Bras. by phone today to assist with re-scheduling a follow up visit with the RN Case Manager  Follow up plan: Unsuccessful telephone outreach attempt made. A HIPAA compliant phone message was left for the patient providing contact information and requesting a return call.  The care management team will reach out to the patient again over the next 7 days.  If patient returns call to provider office, please advise to call CCM Care Guide Antonio Baldwin  at [phone]  Antonio Baldwin, RMA Care Guide [facility]  [place], [place] [address] Direct Dial: [phone] [email or web address]

## 2023-03-02 NOTE — Progress Notes (Signed)
Error wrong context  

## 2023-03-02 NOTE — Progress Notes (Signed)
  Care Coordination Note  03/02/2023 Name: Antonio Baldwin. MRN: [id number] DOB: 1956/02/15  Antonio Bras. is a 67 y.o. year old male who is a primary care patient of Etta Grandchild, MD and is actively engaged with the Chronic Care Management team. I reached out to Antonio Bras. by phone today to assist with re-scheduling a follow up visit with the RN Case Manager  Follow up plan: Unsuccessful telephone outreach attempt made. A HIPAA compliant phone message was left for the patient providing contact information and requesting a return call.  The care management team will reach out to the patient again over the next 7 days.  If patient returns call to provider office, please advise to call CCM Care Guide Penne Lash  at [phone]  Penne Lash, RMA Care Guide [facility]  [place], [place] [address] Direct Dial: [phone] [email or web address]

## 2023-03-03 ENCOUNTER — Telehealth: Payer: No Typology Code available for payment source

## 2023-03-05 ENCOUNTER — Ambulatory Visit: Payer: No Typology Code available for payment source | Admitting: Internal Medicine

## 2023-03-31 NOTE — Progress Notes (Signed)
  Chronic Care Management Note  03/31/2023 Name: Antonio Baldwin. MRN: [id number] DOB: 03-26-1956  Antonio Baldwin. is a 67 y.o. year old male who is a primary care patient of Etta Grandchild, MD and is actively engaged with the Chronic Care Management team. I reached out to Antonio Baldwin. by phone today to assist with re-scheduling a follow up visit with the RN Case Manager  Follow up plan: Unsuccessful telephone outreach attempt made. A HIPAA compliant phone message was left for the patient providing contact information and requesting a return call.  The care management team will reach out to the patient again over the next 7 days.  If patient returns call to provider office, please advise to call CCM Care Guide Penne Lash  at [phone]  Penne Lash, RMA Care Guide [facility]  [place], [place] [address] Direct Dial: [phone] Divinity [email or web address]

## 2023-04-06 NOTE — Progress Notes (Signed)
  Chronic Care Management Note  04/06/2023 Name: Linkin Gerges. MRN: [id number] DOB: 1956/04/20  [name] is a 67 y.o. year old male who is a primary care patient of Etta Grandchild, MD and is actively engaged with the Chronic Care Management team. I reached out to [name] by phone today to assist with re-scheduling a follow up visit with the RN Case Manager  Follow up plan: Unable to make contact on outreach attempts x 3. PCP Etta Grandchild, MD notified via routed documentation in medical record.   Penne Lash, RMA Care Guide [facility]  [place], [place] [address] Direct Dial: [phone] [email or web address]

## 2023-06-09 ENCOUNTER — Ambulatory Visit: Payer: No Typology Code available for payment source | Admitting: Orthopaedic Surgery

## 2023-07-21 ENCOUNTER — Ambulatory Visit: Payer: No Typology Code available for payment source

## 2023-09-01 ENCOUNTER — Ambulatory Visit: Payer: Medicare PPO | Admitting: Internal Medicine

## 2023-09-01 ENCOUNTER — Encounter: Payer: Self-pay | Admitting: Internal Medicine

## 2023-09-01 VITALS — BP 120/80 | HR 109 | Temp 99.1°F | Ht 68.0 in | Wt 158.0 lb

## 2023-09-01 DIAGNOSIS — R112 Nausea with vomiting, unspecified: Secondary | ICD-10-CM

## 2023-09-01 DIAGNOSIS — M19011 Primary osteoarthritis, right shoulder: Secondary | ICD-10-CM | POA: Insufficient documentation

## 2023-09-01 DIAGNOSIS — E119 Type 2 diabetes mellitus without complications: Secondary | ICD-10-CM

## 2023-09-01 DIAGNOSIS — H25813 Combined forms of age-related cataract, bilateral: Secondary | ICD-10-CM | POA: Insufficient documentation

## 2023-09-01 DIAGNOSIS — Z794 Long term (current) use of insulin: Secondary | ICD-10-CM | POA: Diagnosis not present

## 2023-09-01 DIAGNOSIS — R1084 Generalized abdominal pain: Secondary | ICD-10-CM | POA: Insufficient documentation

## 2023-09-01 DIAGNOSIS — M7501 Adhesive capsulitis of right shoulder: Secondary | ICD-10-CM | POA: Insufficient documentation

## 2023-09-01 HISTORY — DX: Nausea with vomiting, unspecified: R11.2

## 2023-09-01 MED ORDER — CIPROFLOXACIN HCL 500 MG PO TABS: 500.0000 mg | ORAL_TABLET | Freq: Two times a day (BID) | ORAL | 0 refills | Status: AC

## 2023-09-01 MED ORDER — ONDANSETRON HCL 4 MG PO TABS: 4.0000 mg | ORAL_TABLET | Freq: Three times a day (TID) | ORAL | 0 refills | Status: DC | PRN

## 2023-09-01 MED ORDER — PANTOPRAZOLE SODIUM 40 MG PO TBEC: 40.0000 mg | DELAYED_RELEASE_TABLET | Freq: Every day | ORAL | 1 refills | Status: DC

## 2023-09-01 MED ORDER — TRAMADOL HCL 50 MG PO TABS
50.0000 mg | ORAL_TABLET | Freq: Three times a day (TID) | ORAL | 0 refills | Status: AC | PRN
Start: 2023-09-01 — End: 2023-09-06

## 2023-09-01 MED ORDER — CEFTRIAXONE SODIUM 1 G IJ SOLR
1.0000 g | Freq: Once | INTRAMUSCULAR | Status: AC
Start: 2023-09-01 — End: 2023-09-01
  Administered 2023-09-01: 1 g via INTRAMUSCULAR

## 2023-09-01 MED ORDER — ONDANSETRON HCL 4 MG PO TABS
4.0000 mg | ORAL_TABLET | Freq: Once | ORAL | Status: AC
Start: 2023-09-01 — End: 2023-09-01
  Administered 2023-09-01: 4 mg via ORAL

## 2023-09-01 NOTE — Assessment & Plan Note (Addendum)
New episode. Recurrent abd pain, n/v.  Unclear etiology.  Differential diagnosis includes diverticulitis, pancreatitis, gastroparesis.  Less likely appendicitis and other. I advised to go to ER, they declined due to previous ER trips - "I was kept in the ER overnight and discharged.  They did not know what was wrong with me" Labs and abd X ray STAT Go to ER if worse! Zofran IM now Zofran po Oral hydration Rocephin IM Cipro po Tramadol as needed pain Do not take Ozempic, metformin, Crestor

## 2023-09-01 NOTE — Assessment & Plan Note (Signed)
Go to ER if worse Zofran IM now Zofran po Oral hydration

## 2023-09-01 NOTE — Patient Instructions (Signed)
Go to ER if worse 

## 2023-09-01 NOTE — Progress Notes (Signed)
Subjective:  Patient ID: Antonio Bras., male    DOB: 1956-07-03  Age: 67 y.o. MRN: [id number]  CC: Abdominal Pain (Pt states he is having nausea, diarrhea, chills, vomiting x3 days and lower abdomen pain./Pt checked his Blood Sugars today and he states it was 226. Pt states he has not eaten in 3 days due to above sxs and unable to keep anything down. Pt tested for COVID and it was neg... Thinks he has food poisoning after eating strawberries.)   HPI Antonio Bras. presents for abd pain, n/v starting this past weekend.  The pain in the abdomen is diffuse, spastic.  He has been able to drink liquids.  Unable to tolerate food.  The pain is severe at times, however, he slept last night.  He thinks that the symptoms started after he ate a full bowl of strawberries.  He threw up today. He took his last Ozempic injection over 1 week ago or longer.  He states that he had similar episodes 2-3 times over the past couple years: "I was kept in the ER overnight and discharged.  They did not know what was wrong with me"  Outpatient Medications Prior to Visit  Medication Sig Dispense Refill   albuterol (VENTOLIN HFA) 108 (90 Base) MCG/ACT inhaler Inhale 1-2 puffs into the lungs every 4 (four) hours as needed for wheezing or shortness of breath.     amLODipine (NORVASC) 10 MG tablet Take 10 mg by mouth daily.     aspirin EC 81 MG tablet Take 1 tablet (81 mg total) by mouth 2 (two) times daily. To be taken after surgery to prevent blood clots 84 tablet 0   Azilsartan Medoxomil 80 MG TABS Take by mouth.     Continuous Blood Gluc Receiver (DEXCOM G7 RECEIVER) DEVI 1 Act by Does not apply route daily. 9 each 1   Continuous Blood Gluc Sensor (DEXCOM G7 SENSOR) MISC 1 Act by Does not apply route daily. 9 each 1   empagliflozin (JARDIANCE) 25 MG TABS tablet Take 25 mg by mouth daily.     Glucagon (GVOKE HYPOPEN 2-PACK) 1 MG/0.2ML SOAJ Inject 1 Act into the skin daily as needed. 2 mL 5   insulin  glargine (LANTUS) 100 UNIT/ML injection Inject 0.5 mLs (50 Units total) into the skin daily. (Patient taking differently: Inject 30 Units into the skin daily.) 10 mL 11   metFORMIN (GLUCOPHAGE) 500 MG tablet Take 500 mg by mouth 2 (two) times daily with a meal.     methocarbamol (ROBAXIN) 500 MG tablet Take 1 tablet (500 mg total) by mouth 2 (two) times daily as needed. To be taken after surgery (Patient taking differently: Take 500 mg by mouth 2 (two) times daily as needed for muscle spasms. To be taken after surgery) 20 tablet 2   montelukast (SINGULAIR) 10 MG tablet Take 10 mg by mouth daily.     omeprazole (PRILOSEC) 20 MG capsule Take 20 mg by mouth daily.     ondansetron (ZOFRAN) 4 MG tablet Take 1 tablet (4 mg total) by mouth every 8 (eight) hours as needed for nausea or vomiting. 40 tablet 0   potassium chloride (KLOR-CON M) 10 MEQ tablet Take 1 tablet (10 mEq total) by mouth 2 (two) times daily. 180 tablet 0   rosuvastatin (CRESTOR) 20 MG tablet Take 1 tablet (20 mg total) by mouth daily. 90 tablet 1   Semaglutide,0.25 or 0.5MG /DOS, 2 MG/3ML SOPN Inject into the skin.  thiamine (VITAMIN B1) 100 MG tablet Take 1 tablet (100 mg total) by mouth daily. 90 tablet 0   No facility-administered medications prior to visit.    ROS: Review of Systems  Constitutional:  Positive for chills, fatigue and fever. Negative for appetite change and unexpected weight change.  HENT:  Negative for congestion, nosebleeds, sneezing, sore throat and trouble swallowing.   Eyes:  Negative for itching and visual disturbance.  Respiratory:  Negative for cough.   Cardiovascular:  Negative for chest pain, palpitations and leg swelling.  Gastrointestinal:  Positive for abdominal pain, diarrhea, nausea and vomiting. Negative for abdominal distention and blood in stool.  Genitourinary:  Negative for frequency and hematuria.  Musculoskeletal:  Negative for back pain, gait problem, joint swelling and neck pain.   Skin:  Negative for rash.  Neurological:  Negative for dizziness, tremors, speech difficulty and weakness.  Psychiatric/Behavioral:  Negative for agitation, dysphoric mood and sleep disturbance. The patient is not nervous/anxious.     Objective:  BP 120/80 (BP Location: Right Arm, Patient Position: Sitting, Cuff Size: Normal)   Pulse (!) 109   Temp 99.1 F (37.3 C) (Oral)   Ht 5\' 8"  (1.727 m)   Wt 158 lb (71.7 kg)   SpO2 99%   BMI 24.02 kg/m   BP Readings from Last 3 Encounters:  09/01/23 120/80  12/01/22 126/72  11/25/22 (!) 120/52    Wt Readings from Last 3 Encounters:  09/01/23 158 lb (71.7 kg)  12/01/22 166 lb (75.3 kg)  11/24/22 173 lb 1 oz (78.5 kg)    Physical Exam Constitutional:      General: He is not in acute distress.    Appearance: He is well-developed. He is ill-appearing. He is not toxic-appearing.     Comments: NAD  Eyes:     Conjunctiva/sclera: Conjunctivae normal.     Pupils: Pupils are equal, round, and reactive to light.  Neck:     Thyroid: No thyromegaly.     Vascular: No JVD.  Cardiovascular:     Rate and Rhythm: Normal rate and regular rhythm.     Heart sounds: Normal heart sounds. No murmur heard.    No friction rub. No gallop.  Pulmonary:     Effort: Pulmonary effort is normal. No respiratory distress.     Breath sounds: Normal breath sounds. No wheezing or rales.  Chest:     Chest wall: No tenderness.  Abdominal:     General: There is no distension or abdominal bruit.     Palpations: Abdomen is soft. There is no fluid wave, hepatomegaly, splenomegaly or mass.     Tenderness: There is generalized abdominal tenderness and tenderness in the left lower quadrant. There is no right CVA tenderness, left CVA tenderness, guarding or rebound. Negative signs include Murphy's sign.  Musculoskeletal:        General: No tenderness. Normal range of motion.     Cervical back: Normal range of motion.  Lymphadenopathy:     Cervical: No cervical  adenopathy.  Skin:    General: Skin is warm and dry.     Findings: No rash.  Neurological:     Mental Status: He is alert and oriented to person, place, and time.     Cranial Nerves: No cranial nerve deficit.     Motor: No abnormal muscle tone.     Coordination: Coordination normal.     Gait: Gait normal.     Deep Tendon Reflexes: Reflexes are normal and symmetric.  Psychiatric:  Behavior: Behavior normal.        Thought Content: Thought content normal.        Judgment: Judgment normal.   The patient is in mild distress.  He was laying on his stomach when I walked in the exam room.  Oral mucosa was on the drier side.  Abdomen was slightly distended, diffusely tender to palpation.  No rebound.  There was more tenderness in the left lower quadrant  Lab Results  Component Value Date   WBC 9.8 11/25/2022   HGB 11.6 (L) 11/25/2022   HCT 34.2 (L) 11/25/2022   PLT 184 11/25/2022   GLUCOSE 77 11/25/2022   CHOL 93 12/01/2022   TRIG 131.0 12/01/2022   HDL 37.70 (L) 12/01/2022   LDLDIRECT 146.0 09/15/2012   LDLCALC 29 12/01/2022   ALT 14 12/01/2022   AST 12 12/01/2022   NA 139 11/25/2022   K 3.2 (L) 11/25/2022   CL 106 11/25/2022   CREATININE 0.79 11/25/2022   BUN 15 11/25/2022   CO2 22 11/25/2022   TSH 0.27 (L) 12/01/2022   PSA 0.28 12/01/2022   HGBA1C >15.5 (H) 11/24/2022   MICROALBUR 2.1 (H) 12/01/2022    DG Abd 2 Views  Result Date: 11/24/2022 CLINICAL DATA:  Nausea vomiting EXAM: ABDOMEN - 2 VIEW COMPARISON:  CT 06/08/2021 FINDINGS: No free air beneath the diaphragm. Focal gas-filled loop of small bowel measuring 3 cm maximum in the left mid abdomen. Scattered colon and rectal gas is present. Right hip replacement. No radiopaque calculi. IMPRESSION: Focal gas-filled loop of small bowel in the left mid abdomen, nonspecific, could be secondary to focal ileus or enteritis. Electronically Signed   By: Jasmine Pang M.D.   On: 11/24/2022 17:09    Assessment & Plan:    Problem List Items Addressed This Visit     Insulin-requiring or dependent type II diabetes mellitus (HCC)    Hold Metformin, Crestor for now      Relevant Medications   Azilsartan Medoxomil 80 MG TABS   Semaglutide,0.25 or 0.5MG /DOS, 2 MG/3ML SOPN   Generalized abdominal pain - Primary    New episode. Recurrent abd pain, n/v.  Unclear etiology.  Differential diagnosis includes diverticulitis, pancreatitis, gastroparesis.  Less likely appendicitis and other. I advised to go to ER, they declined due to previous ER trips - "I was kept in the ER overnight and discharged.  They did not know what was wrong with me" Labs and abd X ray STAT Go to ER if worse! Zofran IM now Zofran po Oral hydration Rocephin IM Cipro po Tramadol as needed pain Do not take Ozempic, metformin, Crestor      Relevant Orders   CBC with Differential/Platelet   Comprehensive metabolic panel   Urinalysis   Sedimentation rate   Lipase   DG Abd 2 Views   Nausea & vomiting    Go to ER if worse Zofran IM now Zofran po Oral hydration         Meds ordered this encounter  Medications   ciprofloxacin (CIPRO) 500 MG tablet    Sig: Take 1 tablet (500 mg total) by mouth 2 (two) times daily for 10 days.    Dispense:  20 tablet    Refill:  0   ondansetron (ZOFRAN) 4 MG tablet    Sig: Take 1 tablet (4 mg total) by mouth every 8 (eight) hours as needed for nausea or vomiting.    Dispense:  20 tablet    Refill:  0  traMADol (ULTRAM) 50 MG tablet    Sig: Take 1-2 tablets (50-100 mg total) by mouth every 8 (eight) hours as needed for up to 5 days for severe pain.    Dispense:  20 tablet    Refill:  0   pantoprazole (PROTONIX) 40 MG tablet    Sig: Take 1 tablet (40 mg total) by mouth daily.    Dispense:  30 tablet    Refill:  1   cefTRIAXone (ROCEPHIN) injection 1 g   ondansetron (ZOFRAN) tablet 4 mg      Follow-up: No follow-ups on file.  Sonda Primes, MD

## 2023-09-01 NOTE — Assessment & Plan Note (Signed)
Hold Metformin, Crestor for now

## 2023-09-02 ENCOUNTER — Ambulatory Visit (INDEPENDENT_AMBULATORY_CARE_PROVIDER_SITE_OTHER): Payer: Medicare PPO

## 2023-09-02 DIAGNOSIS — R1084 Generalized abdominal pain: Secondary | ICD-10-CM

## 2023-09-02 DIAGNOSIS — R109 Unspecified abdominal pain: Secondary | ICD-10-CM | POA: Diagnosis not present

## 2023-09-02 DIAGNOSIS — R112 Nausea with vomiting, unspecified: Secondary | ICD-10-CM | POA: Diagnosis not present

## 2023-09-02 LAB — URINALYSIS, ROUTINE W REFLEX MICROSCOPIC
Ketones, ur: >=80 — AB
Leukocytes,Ua: NEGATIVE
Nitrite: NEGATIVE
Specific Gravity, Urine: >=1.03 — AB (ref 1.000–1.030)
Total Protein, Urine: 30 — AB
Urine Glucose: 250 — AB
Urobilinogen, UA: 0.2 (ref 0.0–1.0)
pH: 6 (ref 5.0–8.0)

## 2023-09-02 LAB — COMPREHENSIVE METABOLIC PANEL
ALT: 18 U/L (ref 0–53)
AST: 21 U/L (ref 0–37)
Albumin: 4.5 g/dL (ref 3.5–5.2)
Alkaline Phosphatase: 83 U/L (ref 39–117)
BUN: 26 mg/dL — ABNORMAL HIGH (ref 6–23)
CO2: 23 meq/L (ref 19–32)
Calcium: 10 mg/dL (ref 8.4–10.5)
Chloride: 96 meq/L (ref 96–112)
Creatinine, Ser: 1 mg/dL (ref 0.40–1.50)
GFR: 77.44 mL/min (ref >60.00)
Glucose, Bld: 217 mg/dL — ABNORMAL HIGH (ref 70–99)
Potassium: 3.4 meq/L — ABNORMAL LOW (ref 3.5–5.1)
Sodium: 136 meq/L (ref 135–145)
Total Bilirubin: 1 mg/dL (ref 0.2–1.2)
Total Protein: 7.7 g/dL (ref 6.0–8.3)

## 2023-09-02 LAB — SEDIMENTATION RATE: Sed Rate: 40 mm/h — ABNORMAL HIGH (ref 0–20)

## 2023-09-02 LAB — CBC WITH DIFFERENTIAL/PLATELET
Basophils Absolute: 0 10*3/uL (ref 0.0–0.1)
Basophils Relative: 0.2 % (ref 0.0–3.0)
Eosinophils Absolute: 0 10*3/uL (ref 0.0–0.7)
Eosinophils Relative: 0 % (ref 0.0–5.0)
HCT: 44.5 % (ref 39.0–52.0)
Hemoglobin: 14.6 g/dL (ref 13.0–17.0)
Lymphocytes Relative: 34.3 % (ref 12.0–46.0)
Lymphs Abs: 3.5 10*3/uL (ref 0.7–4.0)
MCHC: 32.7 g/dL (ref 30.0–36.0)
MCV: 87.9 fL (ref 78.0–100.0)
Monocytes Absolute: 1.1 10*3/uL — ABNORMAL HIGH (ref 0.1–1.0)
Monocytes Relative: 11.2 % (ref 3.0–12.0)
Neutro Abs: 5.5 10*3/uL (ref 1.4–7.7)
Neutrophils Relative %: 54.3 % (ref 43.0–77.0)
Platelets: 310 10*3/uL (ref 150.0–400.0)
RBC: 5.06 Mil/uL (ref 4.22–5.81)
RDW: 13.4 % (ref 11.5–15.5)
WBC: 10.1 10*3/uL (ref 4.0–10.5)

## 2023-09-02 LAB — LIPASE: Lipase: 29 U/L (ref 11.0–59.0)

## 2023-11-26 ENCOUNTER — Emergency Department (HOSPITAL_COMMUNITY): Payer: No Typology Code available for payment source

## 2023-11-26 ENCOUNTER — Encounter (HOSPITAL_COMMUNITY): Payer: Self-pay

## 2023-11-26 ENCOUNTER — Emergency Department (HOSPITAL_COMMUNITY)
Admission: EM | Admit: 2023-11-26 | Discharge: 2023-11-27 | Disposition: A | Discharge status: Home / Self Care | Payer: No Typology Code available for payment source | Attending: Emergency Medicine | Admitting: Emergency Medicine

## 2023-11-26 DIAGNOSIS — Z794 Long term (current) use of insulin: Secondary | ICD-10-CM | POA: Insufficient documentation

## 2023-11-26 DIAGNOSIS — Z79899 Other long term (current) drug therapy: Secondary | ICD-10-CM | POA: Diagnosis not present

## 2023-11-26 DIAGNOSIS — I10 Essential (primary) hypertension: Secondary | ICD-10-CM | POA: Diagnosis not present

## 2023-11-26 DIAGNOSIS — E119 Type 2 diabetes mellitus without complications: Secondary | ICD-10-CM | POA: Insufficient documentation

## 2023-11-26 DIAGNOSIS — D72829 Elevated white blood cell count, unspecified: Secondary | ICD-10-CM | POA: Diagnosis not present

## 2023-11-26 DIAGNOSIS — R739 Hyperglycemia, unspecified: Secondary | ICD-10-CM | POA: Diagnosis not present

## 2023-11-26 DIAGNOSIS — S43101A Unspecified dislocation of right acromioclavicular joint, initial encounter: Secondary | ICD-10-CM | POA: Diagnosis not present

## 2023-11-26 DIAGNOSIS — Z7984 Long term (current) use of oral hypoglycemic drugs: Secondary | ICD-10-CM | POA: Insufficient documentation

## 2023-11-26 DIAGNOSIS — J45909 Unspecified asthma, uncomplicated: Secondary | ICD-10-CM | POA: Diagnosis not present

## 2023-11-26 DIAGNOSIS — Z7982 Long term (current) use of aspirin: Secondary | ICD-10-CM | POA: Diagnosis not present

## 2023-11-26 DIAGNOSIS — R112 Nausea with vomiting, unspecified: Secondary | ICD-10-CM

## 2023-11-26 DIAGNOSIS — M25511 Pain in right shoulder: Secondary | ICD-10-CM | POA: Diagnosis present

## 2023-11-26 DIAGNOSIS — S0093XA Contusion of unspecified part of head, initial encounter: Secondary | ICD-10-CM | POA: Insufficient documentation
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
## 2023-11-27 LAB — CBC WITH DIFFERENTIAL/PLATELET
Abs Immature Granulocytes: 0.07 10*3/uL (ref 0.00–0.07)
Basophils Absolute: 0 10*3/uL (ref 0.0–0.1)
Basophils Relative: 0 %
Eosinophils Absolute: 0 10*3/uL (ref 0.0–0.5)
Eosinophils Relative: 0 %
HCT: 41.6 % (ref 39.0–52.0)
Hemoglobin: 13.4 g/dL (ref 13.0–17.0)
Immature Granulocytes: 0 %
Lymphocytes Relative: 18 %
Lymphs Abs: 3.2 10*3/uL (ref 0.7–4.0)
MCH: 28.9 pg (ref 26.0–34.0)
MCHC: 32.2 g/dL (ref 30.0–36.0)
MCV: 89.8 fL (ref 80.0–100.0)
Monocytes Absolute: 1.4 10*3/uL — ABNORMAL HIGH (ref 0.1–1.0)
Monocytes Relative: 8 %
Neutro Abs: 12.8 10*3/uL — ABNORMAL HIGH (ref 1.7–7.7)
Neutrophils Relative %: 74 %
Platelets: 248 10*3/uL (ref 150–400)
RBC: 4.63 MIL/uL (ref 4.22–5.81)
RDW: 13.2 % (ref 11.5–15.5)
WBC: 17.5 10*3/uL — ABNORMAL HIGH (ref 4.0–10.5)
nRBC: 0 % (ref 0.0–0.2)

## 2023-11-27 LAB — COMPREHENSIVE METABOLIC PANEL
ALT: 18 U/L (ref 0–44)
AST: 23 U/L (ref 15–41)
Albumin: 4.3 g/dL (ref 3.5–5.0)
Alkaline Phosphatase: 76 U/L (ref 38–126)
Anion gap: 15 (ref 5–15)
BUN: 10 mg/dL (ref 8–23)
CO2: 21 mmol/L — ABNORMAL LOW (ref 22–32)
Calcium: 9.7 mg/dL (ref 8.9–10.3)
Chloride: 100 mmol/L (ref 98–111)
Creatinine, Ser: 0.92 mg/dL (ref 0.61–1.24)
GFR, Estimated: >60 mL/min (ref >60)
Glucose, Bld: 319 mg/dL — ABNORMAL HIGH (ref 70–99)
Potassium: 3.9 mmol/L (ref 3.5–5.1)
Sodium: 136 mmol/L (ref 135–145)
Total Bilirubin: 1 mg/dL (ref 0.0–1.2)
Total Protein: 7.7 g/dL (ref 6.5–8.1)

## 2023-11-27 LAB — CBG MONITORING, ED: Glucose-Capillary: 307 mg/dL — ABNORMAL HIGH (ref 70–99)

## 2023-11-27 LAB — BETA-HYDROXYBUTYRIC ACID: Beta-Hydroxybutyric Acid: 1.57 mmol/L — ABNORMAL HIGH (ref 0.05–0.27)

## 2023-11-27 LAB — LIPASE, BLOOD: Lipase: 25 U/L (ref 11–51)

## 2023-11-27 MED ORDER — ONDANSETRON 4 MG PO TBDP: ORAL_TABLET | ORAL | 0 refills | Status: DC

## 2023-11-27 MED ORDER — OXYCODONE HCL 5 MG PO TABS: 5.0000 mg | ORAL_TABLET | ORAL | 0 refills | Status: DC | PRN

## 2023-11-27 MED ORDER — OXYCODONE-ACETAMINOPHEN 5-325 MG PO TABS
2.0000 | ORAL_TABLET | Freq: Once | ORAL | Status: AC
  Administered 2023-11-27: 2 via ORAL
  Filled 2023-11-27: qty 2

## 2023-11-27 MED ORDER — ONDANSETRON 4 MG PO TBDP
8.0000 mg | ORAL_TABLET | Freq: Once | ORAL | Status: AC
  Administered 2023-11-27: 8 mg via ORAL
  Filled 2023-11-27: qty 2
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
## 2023-11-27 NOTE — ED Notes (Signed)
Dr. Clayborne Dana at bedside

## 2023-11-27 NOTE — ED Notes (Signed)
..  The patient is A&OX4, ambulatory at d/c with independent steady gait but was wheeled out of ED via wheelchair. NAD. Pt verbalized understanding of d/c instructions, prescriptions and follow up care.

## 2023-12-04 ENCOUNTER — Telehealth: Payer: Self-pay

## 2023-12-04 NOTE — Progress Notes (Signed)
 Transition Care Management Unsuccessful Follow-up Telephone Call  Date of discharge and from where:  11/27/2023 [facility]  Attempts:  2nd Attempt  Reason for unsuccessful TCM follow-up call:  No answer/busy  Maynor Mwangi [facility]  [facility], [facility] Resource Care Guide Direct Dial : [phone]  Website: [email or web address]

## 2023-12-04 NOTE — Progress Notes (Signed)
 Transition Care Management Unsuccessful Follow-up Telephone Call  Date of discharge and from where:  11/27/2023 [facility] [place]  Attempts:  1st Attempt  Reason for unsuccessful TCM follow-up call:  Left voice message  Antonio Baldwin [facility]  [facility], [facility] Resource Care Guide Direct Dial : [phone]  Website: [email or web address]

## 2023-12-09 ENCOUNTER — Ambulatory Visit (INDEPENDENT_AMBULATORY_CARE_PROVIDER_SITE_OTHER): Payer: Medicare PPO | Admitting: Nurse Practitioner

## 2023-12-09 VITALS — BP 120/76 | HR 84 | Temp 98.7°F | Ht 68.0 in | Wt 165.4 lb

## 2023-12-09 DIAGNOSIS — S43101D Unspecified dislocation of right acromioclavicular joint, subsequent encounter: Secondary | ICD-10-CM | POA: Diagnosis not present

## 2023-12-09 DIAGNOSIS — S43101A Unspecified dislocation of right acromioclavicular joint, initial encounter: Secondary | ICD-10-CM | POA: Insufficient documentation

## 2023-12-09 DIAGNOSIS — E139 Other specified diabetes mellitus without complications: Secondary | ICD-10-CM | POA: Diagnosis not present

## 2023-12-09 MED ORDER — INSULIN GLARGINE 100 UNIT/ML ~~LOC~~ SOLN: 35.0000 [IU] | Freq: Every day | SUBCUTANEOUS | Status: DC
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
## 2023-12-09 NOTE — Assessment & Plan Note (Signed)
 Acute Per patient and significant other swelling of the right shoulder joint pain has improved since has been discharged from the hospital.  With that said on exam he does have clear asymmetry and deformity of the right shoulder.  Range of motion of the right shoulder is limited especially with abduction and both internal and external rotation.  I recommend evaluation with orthopedist.  I will refer him to previous orthopedist that he has seen in the past but he will also reach out to [facility] to make sure that this will be covered and if not we will request evaluation with orthopedic at the [place].

## 2023-12-09 NOTE — Assessment & Plan Note (Signed)
 Chronic Fasting blood sugars remain above goal but do seem to be better than they were when he was hospitalized. He will continue Lantus  35 units/day and will check fasting blood sugar daily x 3, if fasting blood sugar greater than 180 will increase dose to 37 units/day.  He will then check fasting blood sugar daily x 3 if fasting blood sugars continues to be greater than 180 he will increase to 39 units/day.  He will then follow-up with PCP before increasing Lantus  any further to reassess.  He has an appointment scheduled in about 6 weeks so he will keep that as scheduled.  In the meantime he will also continue Jardiance  25 mg daily, metformin  500 mg twice a day, and semaglutide  as prescribed. We also discussed cutting back on processed carbohydrates especially sweets and he reports that he will consider doing this but unable to confirm that he will be willing to make significant changes to diet at this time.

## 2023-12-09 NOTE — Patient Instructions (Addendum)
 AC Joint separation  Take Lantus  35units daily. Check fasting blood sugar daily and if after 3 days your fasting blood sugar is >180 increase to lantus  37units. Check fasting blood sugars daily and if after 3 days fasting blood sugar is >180 then increase to 39units. If you have any low blood sugars call our office. Try to cut back on sweets.   See Dr. Rochelle Chu as scheduled

## 2023-12-28 ENCOUNTER — Encounter: Payer: Self-pay | Admitting: Family Medicine

## 2023-12-28 ENCOUNTER — Ambulatory Visit: Payer: Self-pay | Admitting: Internal Medicine

## 2023-12-28 ENCOUNTER — Ambulatory Visit (INDEPENDENT_AMBULATORY_CARE_PROVIDER_SITE_OTHER): Payer: Medicare PPO | Admitting: Family Medicine

## 2023-12-28 VITALS — BP 148/70 | HR 112 | Temp 99.5°F | Resp 20 | Ht 65.0 in | Wt 155.0 lb

## 2023-12-28 DIAGNOSIS — A084 Viral intestinal infection, unspecified: Secondary | ICD-10-CM

## 2023-12-28 DIAGNOSIS — E139 Other specified diabetes mellitus without complications: Secondary | ICD-10-CM

## 2023-12-28 DIAGNOSIS — R0789 Other chest pain: Secondary | ICD-10-CM | POA: Diagnosis not present

## 2023-12-28 MED ORDER — ONDANSETRON 4 MG PO TBDP: 4.0000 mg | ORAL_TABLET | Freq: Three times a day (TID) | ORAL | 0 refills | Status: DC | PRN

## 2023-12-28 MED ORDER — METHOCARBAMOL 500 MG PO TABS: 500.0000 mg | ORAL_TABLET | Freq: Three times a day (TID) | ORAL | 0 refills | Status: AC | PRN

## 2023-12-28 NOTE — Telephone Encounter (Signed)
  Chief Complaint: Vomiting Symptoms: N/V/D Frequency: Ongoing since yesterday AM Pertinent Negatives: Patient denies fever Disposition: [] ED /[] Urgent Care (no appt availability in office) / [x] Appointment(In office/virtual)/ []  [facility] [facility]/ [] Home Care/ [] Refused Recommended Disposition /[] [facility]/ []  Follow-up with PCP Additional Notes: Pt's spouse reports pt began with N/V/D yesterday AM. Denies fever, blood in vomit/stool. Pt had 2 Zofran yesterday with little to no relief. Pt denies SOB, left arm, neck or jaw pain, does report some upper abd pain. OV scheduled today. This RN educated pt on home care, new-worsening symptoms, when to call back/seek emergent care. Pt verbalized understanding and agrees to plan.    Copied from CRM [id number]. Topic: Clinical - Red Word Triage >> Dec 28, 2023 12:24 PM Danika B wrote: Red Word that prompted transfer to Nurse Triage: Vomiting, chest pain, diarrhea, chills. Has not eaten in 2 days, only liquids. Patients wife on the line and currently with husband confirming symptoms. Reason for Disposition  [1] Constant abdominal pain AND [2] present > 2 hours  Answer Assessment - Initial Assessment Questions 1. VOMITING SEVERITY: "How many times have you vomited in the past 24 hours?"     - MILD:  1 - 2 times/day    - MODERATE: 3 - 5 times/day, decreased oral intake without significant weight loss or symptoms of dehydration    - SEVERE: 6 or more times/day, vomits everything or nearly everything, with significant weight loss, symptoms of dehydration      4-5 2. ONSET: "When did the vomiting begin?"      Yesterday morning  4. ABDOMEN PAIN: "Are your having any abdomen pain?" If Yes : "How bad is it and what does it feel like?" (e.g., crampy, dull, intermittent, constant)      Upper abdominal 5. DIARRHEA: "Is there any diarrhea?" If Yes, ask: "How many times today?"      4-5 6. CONTACTS: "Is there anyone else in the family with the  same symptoms?"      None 7. CAUSE: "What do you think is causing your vomiting?"     Unknown 8. HYDRATION STATUS: "Any signs of dehydration?" (e.g., dry mouth [not only dry lips], too weak to stand) "When did you last urinate?"     Drinking fluids, unable to eat 9. OTHER SYMPTOMS: "Do you have any other symptoms?" (e.g., fever, headache, vertigo, vomiting blood or coffee grounds, recent head injury)     None  Protocols used: Vomiting-A-AH

## 2023-12-28 NOTE — Progress Notes (Unsigned)
Assessment & Plan:  ***  Follow up plan: No follow-ups on file.  Antonio Boston, MSN, APRN, FNP-C  Subjective:  HPI: Antonio Baldwin. is a 68 y.o. male presenting on 12/28/2023 for GI Problem (GI : nausea, vomiting and diarrhea x 2 days, unable to eat. Has had chills and is tired as well. )  Patient is accompanied by his wife, who he is okay with being present.  Vomited this morning x3 More yesterday Yesterday morning diarrhea x5 Today x1  Chest pain left side Hurts when he gets up  Stay thuristy more than usual Yes, abd pain Ozempic x6-8 months Last dose change 1 week ago - 0.5 now; 0.25 prior to that time Dr. Caryn Section and Dr. Yetta Barre manage DM Last labs 6 months ago  Skips lantus & metformin in the evening  Refer Christy   ROS: Negative unless specifically indicated above in HPI.   Relevant past medical history reviewed and updated as indicated.   Allergies and medications reviewed and updated.   Current Outpatient Medications:  .  albuterol (VENTOLIN HFA) 108 (90 Base) MCG/ACT inhaler, Inhale 1-2 puffs into the lungs every 4 (four) hours as needed for wheezing or shortness of breath., Disp: , Rfl:  .  amLODipine (NORVASC) 10 MG tablet, Take 10 mg by mouth daily., Disp: , Rfl:  .  Azilsartan Medoxomil 80 MG TABS, Take by mouth., Disp: , Rfl:  .  empagliflozin (JARDIANCE) 25 MG TABS tablet, Take 25 mg by mouth daily., Disp: , Rfl:  .  Glucagon (GVOKE HYPOPEN 2-PACK) 1 MG/0.2ML SOAJ, Inject 1 Act into the skin daily as needed., Disp: 2 mL, Rfl: 5 .  insulin glargine (LANTUS) 100 UNIT/ML injection, Inject 0.35 mLs (35 Units total) into the skin daily., Disp: , Rfl:  .  metFORMIN (GLUCOPHAGE) 500 MG tablet, Take 500 mg by mouth 2 (two) times daily with a meal., Disp: , Rfl:  .  omeprazole (PRILOSEC) 20 MG capsule, Take 20 mg by mouth daily., Disp: , Rfl:  .  oxyCODONE (ROXICODONE) 5 MG immediate release tablet, Take 1 tablet (5 mg total) by mouth every 4 (four) hours as  needed for severe pain (pain score 7-10)., Disp: 20 tablet, Rfl: 0 .  pantoprazole (PROTONIX) 40 MG tablet, Take 1 tablet (40 mg total) by mouth daily., Disp: 30 tablet, Rfl: 1 .  potassium chloride (KLOR-CON M) 10 MEQ tablet, Take 1 tablet (10 mEq total) by mouth 2 (two) times daily., Disp: 180 tablet, Rfl: 0 .  rosuvastatin (CRESTOR) 20 MG tablet, Take 1 tablet (20 mg total) by mouth daily., Disp: 90 tablet, Rfl: 1 .  Semaglutide,0.25 or 0.5MG /DOS, 2 MG/3ML SOPN, Inject into the skin., Disp: , Rfl:  .  thiamine (VITAMIN B1) 100 MG tablet, Take 1 tablet (100 mg total) by mouth daily., Disp: 90 tablet, Rfl: 0 .  Continuous Blood Gluc Receiver (DEXCOM G7 RECEIVER) DEVI, 1 Act by Does not apply route daily. (Patient not taking: Reported on 12/28/2023), Disp: 9 each, Rfl: 1 .  Continuous Blood Gluc Sensor (DEXCOM G7 SENSOR) MISC, 1 Act by Does not apply route daily. (Patient not taking: Reported on 12/28/2023), Disp: 9 each, Rfl: 1 .  methocarbamol (ROBAXIN) 500 MG tablet, Take 1 tablet (500 mg total) by mouth 2 (two) times daily as needed. To be taken after surgery (Patient not taking: Reported on 12/28/2023), Disp: 20 tablet, Rfl: 2 .  ondansetron (ZOFRAN) 4 MG tablet, Take 1 tablet (4 mg total) by mouth every 8 (  eight) hours as needed for nausea or vomiting. (Patient not taking: Reported on 12/28/2023), Disp: 40 tablet, Rfl: 0  No Known Allergies  Objective:   BP (!) 148/70   Pulse (!) 112   Temp 99.5 F (37.5 C)   Resp 20   Ht 5\' 5"  (1.651 m)   Wt 155 lb (70.3 kg)   SpO2 98%   BMI 25.79 kg/m    Physical Exam Vitals reviewed.  Constitutional:      General: He is not in acute distress.    Appearance: Normal appearance. He is not ill-appearing, toxic-appearing or diaphoretic.  HENT:     Head: Normocephalic and atraumatic.  Eyes:     General: No scleral icterus.       Right eye: No discharge.        Left eye: No discharge.     Conjunctiva/sclera: Conjunctivae normal.  Cardiovascular:      Rate and Rhythm: Normal rate and regular rhythm.     Heart sounds: Normal heart sounds. No murmur heard.    No friction rub. No gallop.  Pulmonary:     Effort: Pulmonary effort is normal. No respiratory distress.     Breath sounds: Normal breath sounds. No stridor. No wheezing, rhonchi or rales.  Chest:    Abdominal:     General: Abdomen is flat. Bowel sounds are normal.     Palpations: There is no hepatomegaly or splenomegaly.     Tenderness: There is no abdominal tenderness.  Musculoskeletal:        General: Normal range of motion.     Cervical back: Normal range of motion.  Skin:    General: Skin is warm and dry.  Neurological:     Mental Status: He is alert and oriented to person, place, and time. Mental status is at baseline.  Psychiatric:        Mood and Affect: Mood normal.        Behavior: Behavior normal.        Thought Content: Thought content normal.        Judgment: Judgment normal.

## 2023-12-29 ENCOUNTER — Telehealth: Payer: Self-pay

## 2023-12-29 NOTE — Progress Notes (Signed)
 Care Guide Pharmacy Note  12/29/2023 Name: Antonio Baldwin. MRN: [id number] DOB: Jan 22, 1956  Referred By: Joshua Debby LITTIE, MD Reason for referral: Care Coordination (Outreach to schedule with Pharm d )   Antonio LITTIE Queen Mickey. is a 68 y.o. year old male who is a primary care patient of Joshua Debby LITTIE, MD.  Antonio LITTIE Queen Mickey. was referred to the pharmacist for assistance related to: DMII  An unsuccessful telephone outreach was attempted today to contact the patient who was referred to the pharmacy team for assistance with medication management. Additional attempts will be made to contact the patient.  Antonio Baldwin , RMA     [facility]  [facility], [facility] Guide  Direct Dial : [phone]  Website: [email or web address]

## 2023-12-31 ENCOUNTER — Emergency Department (HOSPITAL_COMMUNITY): Payer: No Typology Code available for payment source

## 2023-12-31 ENCOUNTER — Emergency Department (HOSPITAL_COMMUNITY)
Admission: EM | Admit: 2023-12-31 | Discharge: 2023-12-31 | Disposition: A | Discharge status: Home / Self Care | Payer: No Typology Code available for payment source | Attending: Emergency Medicine | Admitting: Emergency Medicine

## 2023-12-31 ENCOUNTER — Encounter (HOSPITAL_COMMUNITY): Payer: Self-pay | Admitting: Emergency Medicine

## 2023-12-31 DIAGNOSIS — Z79899 Other long term (current) drug therapy: Secondary | ICD-10-CM | POA: Diagnosis not present

## 2023-12-31 DIAGNOSIS — J45909 Unspecified asthma, uncomplicated: Secondary | ICD-10-CM | POA: Insufficient documentation

## 2023-12-31 DIAGNOSIS — Z20822 Contact with and (suspected) exposure to covid-19: Secondary | ICD-10-CM | POA: Insufficient documentation

## 2023-12-31 DIAGNOSIS — Z7984 Long term (current) use of oral hypoglycemic drugs: Secondary | ICD-10-CM | POA: Insufficient documentation

## 2023-12-31 DIAGNOSIS — I10 Essential (primary) hypertension: Secondary | ICD-10-CM | POA: Diagnosis not present

## 2023-12-31 DIAGNOSIS — R112 Nausea with vomiting, unspecified: Secondary | ICD-10-CM | POA: Diagnosis present

## 2023-12-31 DIAGNOSIS — R1084 Generalized abdominal pain: Secondary | ICD-10-CM | POA: Diagnosis not present

## 2023-12-31 DIAGNOSIS — E119 Type 2 diabetes mellitus without complications: Secondary | ICD-10-CM | POA: Diagnosis not present

## 2023-12-31 LAB — CBC
HCT: 44.5 % (ref 39.0–52.0)
Hemoglobin: 14.7 g/dL (ref 13.0–17.0)
MCH: 28.5 pg (ref 26.0–34.0)
MCHC: 33 g/dL (ref 30.0–36.0)
MCV: 86.2 fL (ref 80.0–100.0)
Platelets: 284 10*3/uL (ref 150–400)
RBC: 5.16 MIL/uL (ref 4.22–5.81)
RDW: 12.7 % (ref 11.5–15.5)
WBC: 10.5 10*3/uL (ref 4.0–10.5)
nRBC: 0 % (ref 0.0–0.2)

## 2023-12-31 LAB — COMPREHENSIVE METABOLIC PANEL
ALT: 20 U/L (ref 0–44)
AST: 23 U/L (ref 15–41)
Albumin: 4.2 g/dL (ref 3.5–5.0)
Alkaline Phosphatase: 79 U/L (ref 38–126)
Anion gap: 16 — ABNORMAL HIGH (ref 5–15)
BUN: 24 mg/dL — ABNORMAL HIGH (ref 8–23)
CO2: 20 mmol/L — ABNORMAL LOW (ref 22–32)
Calcium: 9.6 mg/dL (ref 8.9–10.3)
Chloride: 97 mmol/L — ABNORMAL LOW (ref 98–111)
Creatinine, Ser: 0.91 mg/dL (ref 0.61–1.24)
GFR, Estimated: >60 mL/min (ref >60)
Glucose, Bld: 289 mg/dL — ABNORMAL HIGH (ref 70–99)
Potassium: 3.4 mmol/L — ABNORMAL LOW (ref 3.5–5.1)
Sodium: 133 mmol/L — ABNORMAL LOW (ref 135–145)
Total Bilirubin: 1.7 mg/dL — ABNORMAL HIGH (ref 0.0–1.2)
Total Protein: 8.3 g/dL — ABNORMAL HIGH (ref 6.5–8.1)

## 2023-12-31 LAB — URINALYSIS, ROUTINE W REFLEX MICROSCOPIC
Bacteria, UA: NONE SEEN
Bilirubin Urine: NEGATIVE
Glucose, UA: >=500 mg/dL — AB
Hgb urine dipstick: NEGATIVE
Ketones, ur: 80 mg/dL — AB
Leukocytes,Ua: NEGATIVE
Nitrite: NEGATIVE
Protein, ur: 30 mg/dL — AB
pH: 5 (ref 5.0–8.0)

## 2023-12-31 LAB — RESP PANEL BY RT-PCR (RSV, FLU A&B, COVID)  RVPGX2
Influenza A by PCR: NEGATIVE
Influenza B by PCR: NEGATIVE
Resp Syncytial Virus by PCR: NEGATIVE
SARS Coronavirus 2 by RT PCR: NEGATIVE

## 2023-12-31 LAB — LIPASE, BLOOD: Lipase: 33 U/L (ref 11–51)

## 2023-12-31 MED ORDER — ONDANSETRON HCL 4 MG/2ML IJ SOLN
4.0000 mg | Freq: Once | INTRAMUSCULAR | Status: AC
  Administered 2023-12-31: 4 mg via INTRAVENOUS
  Filled 2023-12-31: qty 2

## 2023-12-31 MED ORDER — SODIUM CHLORIDE 0.9 % IV BOLUS
1000.0000 mL | Freq: Once | INTRAVENOUS | Status: AC
  Administered 2023-12-31: 1000 mL via INTRAVENOUS

## 2023-12-31 MED ORDER — MORPHINE SULFATE (PF) 4 MG/ML IV SOLN
4.0000 mg | Freq: Once | INTRAVENOUS | Status: AC
  Administered 2023-12-31: 4 mg via INTRAVENOUS
  Filled 2023-12-31: qty 1

## 2023-12-31 MED ORDER — IOHEXOL 300 MG/ML  SOLN
100.0000 mL | Freq: Once | INTRAMUSCULAR | Status: AC | PRN
  Administered 2023-12-31: 100 mL via INTRAVENOUS

## 2023-12-31 NOTE — Discharge Instructions (Addendum)
 We evaluated you in the emergency department for your abdominal pain, nausea and vomiting.  Your testing was reassuring.  Your ultrasound did show a spot on your liver, and the radiologist recommends an MRI as an outpatient to further evaluate this.  Please call your primary doctor to discuss this.  If you have any recurrent symptoms, please return to the emergency department.

## 2023-12-31 NOTE — ED Provider Triage Note (Signed)
 Emergency Medicine Provider Triage Evaluation Note  Antonio LITTIE Queen Mickey. , a 68 y.o. male  was evaluated in triage.  Pt complains of abd pain. RUQ pain with nausea and vomit for the past week.  Clemens several days ago as well with pain to L chest from fall.  No fever, but endorse chills.  No runny sneeze or cough  Review of Systems  Positive: As above Negative: As above  Physical Exam  BP (!) 153/78 (BP Location: Left Arm)   Pulse (!) 109   Temp 98.3 F (36.8 C) (Oral)   Resp 16   SpO2 100%  Gen:   Awake, no distress   Resp:  Normal effort  MSK:   Moves extremities without difficulty  Other:    Medical Decision Making  Medically screening exam initiated at 10:14 AM.  Appropriate orders placed.  Antonio LITTIE Queen Mickey. was informed that the remainder of the evaluation will be completed by another provider, this initial triage assessment does not replace that evaluation, and the importance of remaining in the ED until their evaluation is complete.     Nivia Colon, PA-C 12/31/23 1015

## 2023-12-31 NOTE — ED Triage Notes (Signed)
 Pt here from home with c/o n/v  and not feeling well over the last few days ,pt has not felt like eating but been taking in fluids
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
## 2024-01-06 NOTE — Progress Notes (Signed)
Care Guide Pharmacy Note  01/06/2024 Name: Antonio Baldwin. MRN: [id number] DOB: [birth year]  Referred By: Etta Grandchild, MD Reason for referral: Care Coordination (Outreach to schedule with Pharm d )   Antonio Bras. is a 68 y.o. year old male who is a primary care patient of Etta Grandchild, MD.  Antonio Bras. was referred to the pharmacist for assistance related to: DMII  A second unsuccessful telephone outreach was attempted today to contact the patient who was referred to the pharmacy team for assistance with medication management. Additional attempts will be made to contact the patient.  Penne Lash , RMA     [facility]  [facility], [facility] Guide  Direct Dial: [phone]  Website: [email or web address]

## 2024-01-12 NOTE — Progress Notes (Signed)
Care Guide Pharmacy Note  01/12/2024 Name: Tylerjames Hoglund. MRN: [id number] DOB: 1956/04/18  Referred By: Etta Grandchild, MD Reason for referral: Care Coordination (Outreach to schedule with Pharm d )   [name] is a 68 y.o. year old male who is a primary care patient of Etta Grandchild, MD.  [name] was referred to the pharmacist for assistance related to: DMII  A third unsuccessful telephone outreach was attempted today to contact the patient who was referred to the pharmacy team for assistance with medication management. The Population Health team is pleased to engage with this patient at any time in the future upon receipt of referral and should he/she be interested in assistance from the [facility] team.  Penne Lash , RMA     [facility]  [facility], [facility] Guide  Direct Dial: [phone]  Website: [email or web address]

## 2024-01-19 ENCOUNTER — Ambulatory Visit (INDEPENDENT_AMBULATORY_CARE_PROVIDER_SITE_OTHER): Payer: No Typology Code available for payment source | Admitting: Internal Medicine

## 2024-01-19 ENCOUNTER — Encounter (HOSPITAL_COMMUNITY): Payer: Self-pay

## 2024-01-19 ENCOUNTER — Encounter: Payer: Self-pay | Admitting: Internal Medicine

## 2024-01-19 ENCOUNTER — Telehealth: Payer: Self-pay

## 2024-01-19 ENCOUNTER — Emergency Department (HOSPITAL_COMMUNITY)
Admission: EM | Admit: 2024-01-19 | Discharge: 2024-01-19 | Discharge status: Against Medical Advice | Payer: No Typology Code available for payment source | Attending: Emergency Medicine | Admitting: Emergency Medicine

## 2024-01-19 VITALS — BP 122/66 | HR 96 | Temp 98.2°F | Resp 16 | Ht 65.0 in | Wt 156.0 lb

## 2024-01-19 DIAGNOSIS — Z125 Encounter for screening for malignant neoplasm of prostate: Secondary | ICD-10-CM | POA: Diagnosis not present

## 2024-01-19 DIAGNOSIS — E785 Hyperlipidemia, unspecified: Secondary | ICD-10-CM

## 2024-01-19 DIAGNOSIS — Z794 Long term (current) use of insulin: Secondary | ICD-10-CM | POA: Insufficient documentation

## 2024-01-19 DIAGNOSIS — R948 Abnormal results of function studies of other organs and systems: Secondary | ICD-10-CM | POA: Insufficient documentation

## 2024-01-19 DIAGNOSIS — I10 Essential (primary) hypertension: Secondary | ICD-10-CM

## 2024-01-19 DIAGNOSIS — Z5321 Procedure and treatment not carried out due to patient leaving prior to being seen by health care provider: Secondary | ICD-10-CM | POA: Insufficient documentation

## 2024-01-19 DIAGNOSIS — R7989 Other specified abnormal findings of blood chemistry: Secondary | ICD-10-CM

## 2024-01-19 DIAGNOSIS — Z0000 Encounter for general adult medical examination without abnormal findings: Secondary | ICD-10-CM

## 2024-01-19 DIAGNOSIS — Z7984 Long term (current) use of oral hypoglycemic drugs: Secondary | ICD-10-CM | POA: Diagnosis not present

## 2024-01-19 DIAGNOSIS — E119 Type 2 diabetes mellitus without complications: Secondary | ICD-10-CM

## 2024-01-19 DIAGNOSIS — E1165 Type 2 diabetes mellitus with hyperglycemia: Secondary | ICD-10-CM | POA: Diagnosis present

## 2024-01-19 DIAGNOSIS — K760 Fatty (change of) liver, not elsewhere classified: Secondary | ICD-10-CM | POA: Insufficient documentation

## 2024-01-19 DIAGNOSIS — Z0001 Encounter for general adult medical examination with abnormal findings: Secondary | ICD-10-CM

## 2024-01-19 DIAGNOSIS — E139 Other specified diabetes mellitus without complications: Secondary | ICD-10-CM

## 2024-01-19 DIAGNOSIS — Z1159 Encounter for screening for other viral diseases: Secondary | ICD-10-CM | POA: Insufficient documentation

## 2024-01-19 LAB — URINALYSIS, ROUTINE W REFLEX MICROSCOPIC
Bacteria, UA: NONE SEEN
Bilirubin Urine: NEGATIVE
Bilirubin Urine: NEGATIVE
Glucose, UA: >=500 mg/dL — AB
Hgb urine dipstick: NEGATIVE
Hgb urine dipstick: NEGATIVE
Ketones, ur: NEGATIVE
Ketones, ur: NEGATIVE mg/dL
Leukocytes,Ua: NEGATIVE
Leukocytes,Ua: NEGATIVE
Nitrite: NEGATIVE
Nitrite: NEGATIVE
Protein, ur: NEGATIVE mg/dL
RBC / HPF: NONE SEEN (ref 0–?)
Specific Gravity, Urine: 1.01 (ref 1.000–1.030)
Specific Gravity, Urine: 1.032 — ABNORMAL HIGH (ref 1.005–1.030)
Total Protein, Urine: NEGATIVE
Urine Glucose: >=1000 — AB
Urobilinogen, UA: 0.2 (ref 0.0–1.0)
pH: 5.5 (ref 5.0–8.0)
pH: 5 (ref 5.0–8.0)

## 2024-01-19 LAB — BASIC METABOLIC PANEL
BUN: 13 mg/dL (ref 6–23)
CO2: 25 meq/L (ref 19–32)
Calcium: 9.5 mg/dL (ref 8.4–10.5)
Chloride: 98 meq/L (ref 96–112)
Creatinine, Ser: 1.01 mg/dL (ref 0.40–1.50)
GFR: 76.31 mL/min (ref >60.00)
Glucose, Bld: 612 mg/dL (ref 70–99)
Potassium: 4.1 meq/L (ref 3.5–5.1)
Sodium: 135 meq/L (ref 135–145)

## 2024-01-19 LAB — CBC WITH DIFFERENTIAL/PLATELET
Abs Immature Granulocytes: 0.03 10*3/uL (ref 0.00–0.07)
Basophils Absolute: 0 10*3/uL (ref 0.0–0.1)
Basophils Relative: 0 %
Eosinophils Absolute: 0 10*3/uL (ref 0.0–0.5)
Eosinophils Relative: 0 %
HCT: 38.3 % — ABNORMAL LOW (ref 39.0–52.0)
Hemoglobin: 12.4 g/dL — ABNORMAL LOW (ref 13.0–17.0)
Immature Granulocytes: 0 %
Lymphocytes Relative: 46 %
Lymphs Abs: 3.9 10*3/uL (ref 0.7–4.0)
MCH: 29.1 pg (ref 26.0–34.0)
MCHC: 32.4 g/dL (ref 30.0–36.0)
MCV: 89.9 fL (ref 80.0–100.0)
Monocytes Absolute: 0.5 10*3/uL (ref 0.1–1.0)
Monocytes Relative: 6 %
Neutro Abs: 3.9 10*3/uL (ref 1.7–7.7)
Neutrophils Relative %: 48 %
Platelets: 237 10*3/uL (ref 150–400)
RBC: 4.26 MIL/uL (ref 4.22–5.81)
RDW: 12.9 % (ref 11.5–15.5)
WBC: 8.4 10*3/uL (ref 4.0–10.5)
nRBC: 0 % (ref 0.0–0.2)

## 2024-01-19 LAB — COMPREHENSIVE METABOLIC PANEL
ALT: 21 U/L (ref 0–44)
AST: 18 U/L (ref 15–41)
Albumin: 3.7 g/dL (ref 3.5–5.0)
Alkaline Phosphatase: 65 U/L (ref 38–126)
Anion gap: 12 (ref 5–15)
BUN: 11 mg/dL (ref 8–23)
CO2: 21 mmol/L — ABNORMAL LOW (ref 22–32)
Calcium: 9.3 mg/dL (ref 8.9–10.3)
Chloride: 101 mmol/L (ref 98–111)
Creatinine, Ser: 1.2 mg/dL (ref 0.61–1.24)
GFR, Estimated: >60 mL/min (ref >60)
Glucose, Bld: 602 mg/dL (ref 70–99)
Potassium: 4.1 mmol/L (ref 3.5–5.1)
Sodium: 134 mmol/L — ABNORMAL LOW (ref 135–145)
Total Bilirubin: 0.4 mg/dL (ref 0.0–1.2)
Total Protein: 7 g/dL (ref 6.5–8.1)

## 2024-01-19 LAB — HEMOGLOBIN A1C: Hgb A1c MFr Bld: 14.3 % — ABNORMAL HIGH (ref 4.6–6.5)

## 2024-01-19 LAB — PROTIME-INR
INR: 0.9 {ratio} (ref 0.8–1.0)
Prothrombin Time: 9.7 s (ref 9.6–13.1)

## 2024-01-19 LAB — MICROALBUMIN / CREATININE URINE RATIO
Creatinine,U: 55 mg/dL
Microalb Creat Ratio: 14.9 mg/g (ref 0.0–30.0)
Microalb, Ur: 0.8 mg/dL (ref 0.0–1.9)

## 2024-01-19 LAB — LIPID PANEL
Cholesterol: 145 mg/dL (ref 0–200)
HDL: 37.2 mg/dL — ABNORMAL LOW (ref >39.00)
NonHDL: 107.81
Total CHOL/HDL Ratio: 4
Triglycerides: 435 mg/dL — ABNORMAL HIGH (ref 0.0–149.0)
VLDL: 87 mg/dL — ABNORMAL HIGH (ref 0.0–40.0)

## 2024-01-19 LAB — LIPASE, BLOOD: Lipase: 54 U/L — ABNORMAL HIGH (ref 11–51)

## 2024-01-19 LAB — LDL CHOLESTEROL, DIRECT: Direct LDL: 58 mg/dL

## 2024-01-19 LAB — PSA: PSA: 0.6 ng/mL (ref 0.10–4.00)

## 2024-01-19 LAB — CBG MONITORING, ED: Glucose-Capillary: 588 mg/dL (ref 70–99)

## 2024-01-19 NOTE — ED Provider Triage Note (Signed)
 Emergency Medicine Provider Triage Evaluation Note  Antonio Bras. , a 67 y.o. male  was evaluated in triage.  Pt complains of high blood sugar.  Was advised by his PCP today that he had high blood sugar levels.  Reports that he went to his PCP for his annual checkup.  States that he has been drinking an excessive amount of Kool-Aid over the weekend.  Reports this is why sugars up.  States he takes Ozempic, Lantus and metformin for diabetes.  Denies missing any doses.  Denies three-piece, nausea, vomiting or abdominal pain.  Denies fevers.  Review of Systems  Positive:  Negative:   Physical Exam  BP 137/79 (BP Location: Right Arm)   Pulse 97   Temp 98 F (36.7 C)   Resp 18   SpO2 100%  Gen:   Awake, no distress   Resp:  Normal effort  MSK:   Moves extremities without difficulty  Other:    Medical Decision Making  Medically screening exam initiated at 2:25 PM.  Appropriate orders placed.  Antonio Bras. was informed that the remainder of the evaluation will be completed by another provider, this initial triage assessment does not replace that evaluation, and the importance of remaining in the ED until their evaluation is complete.     Al Decant, PA-C 01/19/24 1426

## 2024-01-19 NOTE — ED Notes (Signed)
 Pt reports he is tired of waiting, feels fine, and is ready to leave.  Pt encouraged to stay, but is still adamant about leaving.  Pt encouraged to return if he starts feeling bad and to follow-up with his PCP.

## 2024-01-19 NOTE — ED Triage Notes (Addendum)
 Pt presents d/t hyperglycemia.  Pt denies abdominal pain and n/v/d.  Pt reports taking medications as prescribed.     Pt reports drinking a significant amount of Kool-Aid.
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
## 2024-01-19 NOTE — Patient Instructions (Signed)
Type 1 Diabetes Mellitus, Diagnosis, Adult  Type 1 diabetes mellitus, or type 1 diabetes, is a long-term (chronic) disease. It happens when the cells in the pancreas that make a hormone called insulin are destroyed. Normally, insulin lets blood sugar (glucose) enter cells in your body. This gives you energy. If you have type 1 diabetes, glucose cannot get into cells. It builds up in the blood instead. This causes high blood glucose (hyperglycemia). There is no cure for type 1 diabetes. Treatment can help you manage your condition. What are the causes? The exact cause of type 1 diabetes is not known. What increases the risk? You may be more likely to have type 1 diabetes if a family member has it as well. You may also be more at risk if: You have a gene for type 1 diabetes that was passed down to you from a parent (inherited). You have an autoimmune disorder. This means that your body's disease-fighting system (immune system) attacks your body. You have been exposed to certain viruses. You live in an area with cold weather. What are the signs or symptoms? Symptoms may begin slowly over days or weeks. They may also start all of a sudden. Symptoms may include: Increased thirst or hunger. Needing to pee (urinate) more often or peeing more at night. Sudden weight changes that you cannot explain. Tiredness (fatigue) or weakness. Vision changes. These may include blurry vision. How is this diagnosed? Type 1 diabetes is diagnosed based on your symptoms, your medical history, a physical exam, and your blood glucose level. Your blood glucose may be checked with: A fasting blood glucose (FBG) test. You will not be allowed to eat (you will fast) for 8 hours or more before a blood sample is taken. A random blood glucose test. This test checks your blood glucose at any time of day no matter when you last ate. A hemoglobin A1C (A1C) blood test. This shows what your blood glucose levels have been over the  last 2-3 months. You may be diagnosed with type 1 diabetes if: Your FBG level is 126 mg/dL (7 mmol/L) or higher. Your random blood glucose level is 200 mg/dL (16.1 mmol/L) or higher. Your A1C level is 6.5% or higher. These blood tests may be done more than once. You may also need other blood tests. How is this treated? You may work with an expert called an endocrinologist to find ways to manage your diabetes. You should also follow instructions from your health care provider. You may need to: Take insulin every day. This helps to keep your blood glucose levels in the right range. Take medicines to help prevent problems from diabetes. You may need to take: Aspirin. Medicine to lower your cholesterol. Medicine to control your blood pressure. Check your blood glucose as often as told. Make diet and lifestyle changes. You may be told to: Follow a nutrition plan that has been made just for you by a dietitian. Get regular exercise. Find ways to manage stress. Your provider will set treatment goals that are right for you. These goals will be based on your age, other conditions you have, and how you respond to treatment. In general, your A1C level should be less than 7% and your blood glucose levels should be: 80-130 mg/dL (0.9-6.0 mmol/L) before meals (preprandial). Below 180 mg/dL (10 mmol/L) after meals (postprandial). Follow these instructions at home: Questions to ask your health care provider You may want to ask your provider: Do I need to meet with a certified  diabetes care and education specialist? Should I join a support group for people with diabetes? What equipment will I need to manage my diabetes at home? What diabetes medicines should I take, and when? How often should I check my blood glucose? What number should I call if I have questions? When is my next appointment? General instructions Take over-the-counter and prescription medicines only as told by your provider. Keep all  follow-up visits. You will need regular blood tests to make sure the treatments are working. Your provider may adjust your medicines based on your test results. Where to find more information American Diabetes Association (ADA): diabetes.org Association of Diabetes Care and Education Specialists (ADCES): diabeteseducator.org International Diabetes Federation (IDF): http://hill.biz/ Contact a health care provider if: Your blood glucose level is higher than 240 mg/dL (16.1 mmol/L) for 2 days in a row. You have been sick or have had a fever for 2 days or more, and you are not getting better. For more than 6 hours, you: Cannot eat or drink. Have nausea and vomiting. Have diarrhea. Get help right away if: Your blood glucose is less than 54 mg/dL (3 mmol/L). You become confused, or you have trouble thinking clearly. You have trouble breathing. These symptoms may be an emergency. Get help right away. Call 911. Do not wait to see if the symptoms will go away. Do not drive yourself to the hospital. This information is not intended to replace advice given to you by your health care provider. Make sure you discuss any questions you have with your health care provider. Document Revised: 07/14/2022 Document Reviewed: 07/14/2022 Elsevier Patient Education  2024 [employer].

## 2024-01-19 NOTE — Telephone Encounter (Signed)
 CRITICAL VALUE STICKER  CRITICAL VALUE: Glucose 612   RECEIVER (on-site recipient of call): Jazz  DATE & TIME NOTIFIED: 01/19/24 at 1:05PM   MESSENGER (representative from lab): [email or web address]   MD NOTIFIED:  Yes   TIME OF NOTIFICATION: 1:06pm   RESPONSE:

## 2024-01-20 ENCOUNTER — Telehealth: Payer: Self-pay | Admitting: Internal Medicine

## 2024-01-20 ENCOUNTER — Telehealth: Payer: Self-pay | Admitting: *Deleted

## 2024-01-20 MED ORDER — FREESTYLE LIBRE 3 SENSOR MISC
1.0000 | Freq: Every day | 5 refills | Status: DC
Start: 2024-01-20 — End: 2024-03-11

## 2024-01-20 MED ORDER — INSULIN PEN NEEDLE 32G X 6 MM MISC: 1.0000 | Freq: Four times a day (QID) | 0 refills | Status: AC

## 2024-01-20 MED ORDER — GVOKE HYPOPEN 2-PACK 1 MG/0.2ML ~~LOC~~ SOAJ: 1.0000 | Freq: Every day | SUBCUTANEOUS | 5 refills | Status: AC | PRN

## 2024-01-20 MED ORDER — METFORMIN HCL ER 750 MG PO TB24: 750.0000 mg | ORAL_TABLET | Freq: Every day | ORAL | 0 refills | Status: AC

## 2024-01-20 MED ORDER — INSULIN GLARGINE 100 UNITS/ML SOLOSTAR PEN
60.0000 [IU] | PEN_INJECTOR | Freq: Every day | SUBCUTANEOUS | 0 refills | Status: AC
Start: 2024-01-20 — End: ?

## 2024-01-20 MED ORDER — INSULIN LISPRO (1 UNIT DIAL) 100 UNIT/ML (KWIKPEN): 5.0000 [IU] | PEN_INJECTOR | Freq: Three times a day (TID) | SUBCUTANEOUS | 0 refills | Status: DC

## 2024-01-20 MED ORDER — FREESTYLE LIBRE 3 READER DEVI: 1.0000 | Freq: Every day | 3 refills | Status: AC

## 2024-01-20 NOTE — Progress Notes (Unsigned)
 Complex Care Management Note Care Guide Note  01/20/2024 Name: Antonio Baldwin. MRN: [id number] DOB: [birth year]   Complex Care Management Outreach Attempts: An unsuccessful telephone outreach was attempted today to offer the patient information about available complex care management services.  Follow Up Plan:  Additional outreach attempts will be made to offer the patient complex care management information and services.   Encounter Outcome:  No Answer  Burman Nieves, CMA, Care Guide [facility]  [facility], [facility] Guide Direct Dial: [phone]  Fax: [phone] Website: [email or web address]

## 2024-01-20 NOTE — Telephone Encounter (Signed)
 Copied from CRM [id number]. Topic: Clinical - Prescription Issue >> Jan 20, 2024  8:27 AM Kathryne Eriksson wrote: Reason for CRM: insulin lispro [facility]) 100 UNIT/ML KwikPen >> Jan 20, 2024  8:31 AM Kathryne Eriksson wrote: [facility] [phone] (Fax) states the insulin ordered isn't on their list, therefor they're requesting it be changed to Insulin Aspart also requesting for some progress notes be sent over in regards to the continuous glucose freestyle kit.

## 2024-01-20 NOTE — Telephone Encounter (Signed)
 Patient was advised to go to the ED 01/19/2024 at or around 1:15pm.

## 2024-01-21 LAB — THYROID PANEL WITH TSH
Free Thyroxine Index: 2 (ref 1.4–3.8)
T3 Uptake: 30 % (ref 22–35)
T4, Total: 6.8 ug/dL (ref 4.9–10.5)
TSH: 0.22 m[IU]/L — ABNORMAL LOW (ref 0.40–4.50)

## 2024-01-21 LAB — THYROID PEROXIDASE ANTIBODY: Thyroperoxidase Ab SerPl-aCnc: 1 [IU]/mL (ref <9)

## 2024-01-21 LAB — HEPATITIS C ANTIBODY: Hepatitis C Ab: NONREACTIVE

## 2024-01-21 NOTE — Progress Notes (Unsigned)
 Complex Care Management Note Care Guide Note  01/21/2024 Name: Antonio Baldwin. MRN: [id number] DOB: 01/28/1956   Complex Care Management Outreach Attempts: A second unsuccessful outreach was attempted today to offer the patient with information about available complex care management services.  Follow Up Plan:  Additional outreach attempts will be made to offer the patient complex care management information and services.   Encounter Outcome:  No Answer  Burman Nieves, CMA, Care Guide [facility]  [facility], [facility] Guide Direct Dial: [phone]  Fax: [phone] Website: [email or web address]

## 2024-01-22 ENCOUNTER — Other Ambulatory Visit: Payer: Self-pay | Admitting: Internal Medicine

## 2024-01-22 DIAGNOSIS — Z0001 Encounter for general adult medical examination with abnormal findings: Secondary | ICD-10-CM | POA: Insufficient documentation

## 2024-01-22 DIAGNOSIS — E119 Type 2 diabetes mellitus without complications: Secondary | ICD-10-CM

## 2024-01-22 MED ORDER — BOOSTRIX 5-2.5-18.5 LF-MCG/0.5 IM SUSP
0.5000 mL | Freq: Once | INTRAMUSCULAR | 0 refills | Status: AC
Start: 2024-01-22 — End: 2024-01-22

## 2024-01-22 MED ORDER — ROSUVASTATIN CALCIUM 20 MG PO TABS: 20.0000 mg | ORAL_TABLET | Freq: Every day | ORAL | 1 refills | Status: AC

## 2024-01-22 MED ORDER — INSULIN ASPART 100 UNIT/ML FLEXPEN: 5.0000 [IU] | PEN_INJECTOR | Freq: Three times a day (TID) | SUBCUTANEOUS | 1 refills | Status: AC

## 2024-01-22 NOTE — Telephone Encounter (Signed)
 He had [employer] veterans affairs community care plan insurance and he only wants to use the [place] so he doesn't have to pay for any medicine.

## 2024-01-22 NOTE — Telephone Encounter (Signed)
 [facility]'t [facility] that you [facility] for him. Do you want to change his therapy or do I need to advise him he would have to pick this up at his local pharmacy. Also the [facility] need documentation (office notes) going into detail as to what's going on for him to need the continuous glucose kit. She advised me the patient would have to be diagnosed with Hypoglycemia for him to meet the requirements to have them give him one. Please advise.

## 2024-01-22 NOTE — Progress Notes (Signed)
 Complex Care Management Note Care Guide Note  01/22/2024 Name: Antonio Baldwin. MRN: [id number] DOB: 18-Dec-1955   Complex Care Management Outreach Attempts: A third unsuccessful outreach was attempted today to offer the patient with information about available complex care management services.  Follow Up Plan:  No further outreach attempts will be made at this time. We have been unable to contact the patient to offer or enroll patient in complex care management services.  Encounter Outcome:  No Answer  Burman Nieves, CMA, Care Guide [facility]  [facility], [facility] Guide Direct Dial: [phone]  Fax: [phone] Website: [email or web address]

## 2024-02-04 ENCOUNTER — Telehealth: Payer: Self-pay

## 2024-02-04 NOTE — Telephone Encounter (Signed)
 Patient was identified as falling into the [place] Measure - Diabetes.   Patient was: Appointment scheduled for lab or office visit for A1c. Is on a 6 month schedule, not due for another check until August

## 2024-02-15 ENCOUNTER — Ambulatory Visit (INDEPENDENT_AMBULATORY_CARE_PROVIDER_SITE_OTHER)

## 2024-02-15 VITALS — Ht 68.0 in | Wt 156.0 lb

## 2024-02-15 DIAGNOSIS — Z0000 Encounter for general adult medical examination without abnormal findings: Secondary | ICD-10-CM

## 2024-02-15 NOTE — Progress Notes (Signed)
 Subjective:   Antonio Bourke. is a 68 y.o. who presents for a Medicare Wellness preventive visit.  Visit Complete: Virtual I connected with  [name] on 02/15/24 by a audio enabled telemedicine application and verified that I am speaking with the correct person using two identifiers.  Patient Location: Home  Provider Location: Office/Clinic  I discussed the limitations of evaluation and management by telemedicine. The patient expressed understanding and agreed to proceed.  Vital Signs: Because this visit was a virtual/telehealth visit, some criteria may be missing or patient reported. Any vitals not documented were not able to be obtained and vitals that have been documented are patient reported.  VideoDeclined- This patient declined Librarian, academic. Therefore the visit was completed with audio only.  Persons Participating in Visit: Patient.  AWV Questionnaire: No: Patient Medicare AWV questionnaire was not completed prior to this visit.  Cardiac Risk Factors include: advanced age (>44men, >64 women);diabetes mellitus;hypertension;dyslipidemia;male gender     Objective:    Today's Vitals   02/15/24 1058  Weight: 156 lb (70.8 kg)  Height: 5\' 8"  (1.727 m)   Body mass index is 23.72 kg/m.     02/15/2024   10:49 AM 01/19/2024    2:22 PM 11/26/2023    5:35 PM 11/24/2022    5:10 PM 03/24/2022   10:11 AM 03/12/2022    2:09 PM 06/08/2021    8:38 AM  Advanced Directives  Does Patient Have a Medical Advance Directive? No No No No No No No  Would patient like information on creating a medical advance directive? Yes (MAU/Ambulatory/Procedural Areas - Information given) No - Patient declined  No - Patient declined No - Patient declined No - Patient declined No - Patient declined    Current Medications (verified) Outpatient Encounter Medications as of 02/15/2024  Medication Sig   albuterol (VENTOLIN HFA) 108 (90 Base) MCG/ACT inhaler Inhale  1-2 puffs into the lungs every 4 (four) hours as needed for wheezing or shortness of breath.   amLODipine (NORVASC) 10 MG tablet Take 10 mg by mouth daily.   Azilsartan Medoxomil 80 MG TABS Take by mouth.   Continuous Glucose Receiver (FREESTYLE LIBRE 3 READER) DEVI 1 Act by Does not apply route daily.   Continuous Glucose Sensor (FREESTYLE LIBRE 3 SENSOR) MISC 1 Act by Does not apply route daily. Place 1 sensor on the skin every 14 days. Use to check glucose continuously   empagliflozin (JARDIANCE) 25 MG TABS tablet Take 25 mg by mouth daily.   Glucagon (GVOKE HYPOPEN 2-PACK) 1 MG/0.2ML SOAJ Inject 1 Act into the skin daily as needed.   insulin aspart (NOVOLOG) 100 UNIT/ML FlexPen Inject 5 Units into the skin 3 (three) times daily with meals.   insulin glargine (LANTUS) 100 unit/mL SOPN Inject 60 Units into the skin daily.   Insulin Pen Needle 32G X 6 MM MISC 1 Act by Does not apply route 4 (four) times daily.   metFORMIN (GLUCOPHAGE-XR) 750 MG 24 hr tablet Take 1 tablet (750 mg total) by mouth daily with breakfast.   methocarbamol (ROBAXIN) 500 MG tablet Take 1 tablet (500 mg total) by mouth every 8 (eight) hours as needed.   omeprazole (PRILOSEC) 20 MG capsule Take 20 mg by mouth daily.   pantoprazole (PROTONIX) 40 MG tablet Take 1 tablet (40 mg total) by mouth daily.   potassium chloride (KLOR-CON M) 10 MEQ tablet Take 1 tablet (10 mEq total) by mouth 2 (two) times daily.  rosuvastatin (CRESTOR) 20 MG tablet Take 1 tablet (20 mg total) by mouth daily.   thiamine (VITAMIN B1) 100 MG tablet Take 1 tablet (100 mg total) by mouth daily.   [DISCONTINUED] oxyCODONE (ROXICODONE) 5 MG immediate release tablet Take 1 tablet (5 mg total) by mouth every 4 (four) hours as needed for severe pain (pain score 7-10).   No facility-administered encounter medications on file as of 02/15/2024.    Allergies (verified) Patient has no known allergies.   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   Procedure: RIGHT TOTAL HIP REPLACEMENT;  Surgeon: Tarry Kos, MD;  Location: [facility] OR;  Service: Orthopedics;  Laterality: Right;   WISDOM TOOTH EXTRACTION     Family History  Problem Relation Age of Onset   Arthritis Other    Hypertension Other    Diabetes Other    Arthritis Mother    Hypertension Mother    Diabetes Mother    Heart disease Neg Hx    Early death Neg Hx    Stroke Neg Hx    Social History   Socioeconomic History   Marital status: Married    Spouse name: Not on file   Number of children: Not on file   Years of education: Not on file   Highest education level: Not on file  Occupational History   Not on file  Tobacco Use   Smoking status: Former    Current packs/day: 0.00    Types: Cigarettes    Quit date: 09/15/2002    Years since quitting: 21.4    Passive exposure: Past   Smokeless tobacco: Never  Vaping Use   Vaping status: Never Used  Substance and Sexual Activity   Alcohol use: No   Drug use: Yes    Types: Marijuana    Comment: Once or twice a week   Sexual activity: Yes    Partners: Female  Other Topics Concern   Not on file  Social History Narrative   Not on file   Social Drivers of Health   Financial Resource Strain: Low Risk  (02/15/2024)   Overall Financial Resource Strain (CARDIA)    Difficulty of Paying Living Expenses: Not hard at all  Food Insecurity: No Food Insecurity (02/15/2024)   Hunger Vital Sign    Worried About Running Out of Food in the Last Year: Never true    Ran Out of Food in the Last Year: Never true  Transportation Needs: No Transportation Needs (02/15/2024)   PRAPARE - Scientist, research (physical sciences) (Medical): No    Lack of Transportation (Non-Medical): No  Physical Activity: Insufficiently Active (02/15/2024)   Exercise Vital Sign    Days of Exercise per Week: 3 days    Minutes of Exercise per Session: 30 min  Stress: No Stress Concern Present (02/15/2024)   [employer] of Occupational Health - Occupational Stress Questionnaire    Feeling of Stress : Not at all  Social Connections: Socially Isolated (02/15/2024)   Social Connection and Isolation Panel [NHANES]    Frequency of Communication with Friends and Family: Twice a week    Frequency of Social  Gatherings with Friends and Family: Never    Attends Religious Services: Never    Database administrator or Organizations: No    Attends Engineer, structural: Never    Marital Status: Married    Tobacco Counseling Counseling given: No    Clinical Intake:  Pre-visit preparation completed: Yes  Pain : No/denies pain     BMI - recorded: 23.72 Nutritional Status: BMI of 19-24  Normal Nutritional Risks: None Diabetes: Yes CBG done?: No Did pt. bring in CBG monitor from home?: No  Lab Results  Component Value Date   HGBA1C 14.3 (H) 01/19/2024   HGBA1C >15.5 (H) 11/24/2022   HGBA1C 6.7 (H) 03/12/2022     How often do you need to have someone help you when you read instructions, pamphlets, or other written materials from your doctor or pharmacy?: 1 - Never  Interpreter Needed?: No  Information entered by :: Hassell Halim, CMA   Activities of Daily Living     02/15/2024   11:00 AM  In your present state of health, do you have any difficulty performing the following activities:  Hearing? 0  Vision? 0  Difficulty concentrating or making decisions? 0  Walking or climbing stairs? 0  Dressing or bathing? 0  Doing errands, shopping? 0  Preparing Food and eating ? N  Using the Toilet? N  In the past six months, have you accidently leaked urine? N  Do you have problems with loss of bowel  control? N  Managing your Medications? N  Managing your Finances? N  Housekeeping or managing your Housekeeping? N    Patient Care Team: Etta Grandchild, MD as PCP - General (Internal Medicine) Marlowe Sax, RN as Case Manager (General Practice)  Indicate any recent Medical Services you may have received from other than [facility] providers in the past year (date may be approximate).     Assessment:   This is a routine wellness examination for Antonio Baldwin.  Hearing/Vision screen Hearing Screening - Comments:: Denies hearing difficulties   Vision Screening - Comments:: Wears rx glasses - up to date with routine eye exams with the [facility], [facility]   Goals Addressed               This Visit's Progress     Patient Stated (pt-stated)        Patient stated he plans to stay active.       Depression Screen     02/15/2024   11:03 AM 12/28/2023    3:11 PM 09/01/2023    3:41 PM 12/01/2022    8:49 AM 06/20/2021   10:06 AM  PHQ 2/9 Scores  PHQ - 2 Score 0 0 0 0 0  PHQ- 9 Score 0   3     Fall Risk     02/15/2024   11:04 AM 12/28/2023    3:11 PM 09/01/2023    3:41 PM 01/06/2023    9:22 AM  Fall Risk   Falls in the past year? 0 1 0 0  Number falls in past yr: 0 1 0 0  Injury with Fall? 0 0 0 0  Risk for fall due to : No Fall Risks No Fall Risks;History of fall(s) No Fall Risks No Fall Risks  Follow up Falls prevention discussed;Falls evaluation completed Falls evaluation completed Falls evaluation completed Falls evaluation completed;Education provided;Falls prevention discussed    MEDICARE RISK AT HOME:  Medicare Risk at Home Any stairs in or around the home?: Yes (  outside) If so, are there any without handrails?: No Home free of loose throw rugs in walkways, pet beds, electrical cords, etc?: Yes Adequate lighting in your home to reduce risk of falls?: Yes Life alert?: No Use of a cane, walker or w/c?: No Grab bars in the bathroom?: Yes Shower chair or bench in shower?:  No Elevated toilet seat or a handicapped toilet?: Yes  TIMED UP AND GO:  Was the test performed?  No  Cognitive Function: 6CIT completed        02/15/2024   11:05 AM  6CIT Screen  What Year? 0 points  What month? 0 points  What time? 0 points  Count back from 20 0 points  Months in reverse 0 points  Repeat phrase 0 points  Total Score 0 points    Immunizations Immunization History  Administered Date(s) Administered   DTaP 09/12/2012   Influenza Split 09/15/2012   Influenza, High Dose Seasonal PF 08/19/2022   Influenza-Unspecified 08/24/2022   PFIZER(Purple Top)SARS-COV-2 Vaccination 01/18/2020, 02/08/2020, 09/04/2020   PNEUMOCOCCAL CONJUGATE-20 08/15/2021   Pfizer Covid-19 Vaccine Bivalent Booster 27yrs & up 08/15/2021   Pfizer(Comirnaty)Fall Seasonal Vaccine 12 years and older 01/01/2023   Pneumococcal Polysaccharide-23 09/15/2012, 12/21/2013, 07/18/2020   Rsv, Bivalent, Protein Subunit Rsvpref,pf Verdis Frederickson) 01/01/2023   Tdap 07/29/2013   Zoster Recombinant(Shingrix) 12/22/2018, 07/18/2020, 10/09/2020    Screening Tests Health Maintenance  Topic Date Due   OPHTHALMOLOGY EXAM  06/05/2022   COVID-19 Vaccine (6 - 2024-25 season) 07/26/2023   DTaP/Tdap/Td (3 - Td or Tdap) 07/30/2023   INFLUENZA VACCINE  02/22/2024 (Originally 06/25/2023)   Fecal DNA (Cologuard)  02/27/2024   HEMOGLOBIN A1C  07/18/2024   Diabetic kidney evaluation - eGFR measurement  01/18/2025   Diabetic kidney evaluation - Urine ACR  01/18/2025   FOOT EXAM  01/18/2025   Medicare Annual Wellness (AWV)  02/14/2025   Pneumonia Vaccine 40+ Years old  Completed   Hepatitis C Screening  Completed   Zoster Vaccines- Shingrix  Completed   HPV VACCINES  Aged Out    Health Maintenance  Health Maintenance Due  Topic Date Due   OPHTHALMOLOGY EXAM  06/05/2022   COVID-19 Vaccine (6 - 2024-25 season) 07/26/2023   DTaP/Tdap/Td (3 - Td or Tdap) 07/30/2023   Health Maintenance Items Addressed:  02/15/2024  Cologuard status: Per pt, completed and awaiting for results from the [facility].  Asked to obtain the report for our records.  Additional Screening:  Vision Screening: Recommended annual ophthalmology exams for early detection of glaucoma and other disorders of the eye.  Pt stated he has his annual eye exam at the [place].  Dental Screening: Recommended annual dental exams for proper oral hygiene  Community Resource Referral / Chronic Care Management: CRR required this visit?  No   CCM required this visit?  No     Plan:     I have personally reviewed and noted the following in the patient's chart:   Medical and social history Use of alcohol, tobacco or illicit drugs  Current medications and supplements including opioid prescriptions. Patient is not currently taking opioid prescriptions. Functional ability and status Nutritional status Physical activity Advanced directives List of other physicians Hospitalizations, surgeries, and ER visits in previous 12 months Vitals Screenings to include cognitive, depression, and falls Referrals and appointments  In addition, I have reviewed and discussed with patient certain preventive protocols, quality metrics, and best practice recommendations. A written personalized care plan for preventive services as well as general  preventive health recommendations were provided to patient.     Darreld Mclean, CMA   02/15/2024   After Visit Summary: (MyChart) Due to this being a telephonic visit, the after visit summary with patients personalized plan was offered to patient via MyChart   Notes: Nothing significant to report at this time.

## 2024-02-15 NOTE — Patient Instructions (Addendum)
 Antonio Baldwin , Thank you for taking time to come for your Medicare Wellness Visit. I appreciate your ongoing commitment to your health goals. Please review the following plan we discussed and let me know if I can assist you in the future.   Referrals/Orders/Follow-Ups/Clinician Recommendations: Aim for 30 minutes of exercise or brisk walking, 6-8 glasses of water, and 5 servings of fruits and vegetables each day. Patient plans to follow up at the [facility] 01/2024 to get Tdap vaccine and will obtain the Cologuard test results for his PCP.  This is a list of the screening recommended for you and due dates:  Health Maintenance  Topic Date Due   Eye exam for diabetics  06/05/2022   COVID-19 Vaccine (6 - 2024-25 season) 07/26/2023   DTaP/Tdap/Td vaccine (3 - Td or Tdap) 07/30/2023   Flu Shot  02/22/2024*   Cologuard (Stool DNA test)  02/27/2024   Hemoglobin A1C  07/18/2024   Yearly kidney function blood test for diabetes  01/18/2025   Yearly kidney health urinalysis for diabetes  01/18/2025   Complete foot exam   01/18/2025   Medicare Annual Wellness Visit  02/14/2025   Pneumonia Vaccine  Completed   Hepatitis C Screening  Completed   Zoster (Shingles) Vaccine  Completed   HPV Vaccine  Aged Out  *Topic was postponed. The date shown is not the original due date.    Advanced directives: (Provided) Advance directive discussed with you today. I have provided a copy for you to complete at home and have notarized. Once this is complete, please bring a copy in to our office so we can scan it into your chart.   Next Medicare Annual Wellness Visit scheduled for next year: Yes

## 2024-02-19 ENCOUNTER — Other Ambulatory Visit

## 2024-03-01 ENCOUNTER — Ambulatory Visit (INDEPENDENT_AMBULATORY_CARE_PROVIDER_SITE_OTHER): Admitting: Pharmacist

## 2024-03-01 DIAGNOSIS — Z794 Long term (current) use of insulin: Secondary | ICD-10-CM | POA: Diagnosis not present

## 2024-03-01 DIAGNOSIS — E119 Type 2 diabetes mellitus without complications: Secondary | ICD-10-CM | POA: Diagnosis not present

## 2024-03-01 NOTE — Progress Notes (Signed)
 03/01/2024 Name: Algenis Ballin. MRN: [id number] DOB: 1956-10-07  Chief Complaint  Patient presents with   Diabetes   Medication Management    Nickoli Bagheri. is a 68 y.o. year old male who was referred for medication management by their primary care provider, Arcadio Knuckles, MD. They presented for a face to face visit today.   They were referred to the pharmacist by their PCP for assistance in managing diabetes     Subjective:  Care Team: Primary Care Provider: Arcadio Knuckles, MD ; Next Scheduled Visit: 02/27/2025 Follows with provider at the [facility]  Medication Access/Adherence  Current Pharmacy:  [facility] [place], [place] - [address] [place] [address] Phone: [phone] Fax: [phone]   Patient reports affordability concerns with their medications: No  Patient reports access/transportation concerns to their pharmacy: No  Patient reports adherence concerns with their medications:  Yes    Gets his medications from the [place]   Diabetes:  Current medications: Lantus  39 units daily, Novolog  5 units TID AC, metformin  1000 mg BID, Ozempic  0.5 mg weekly (pt reports he stops Ozempic  once his BG go down) Not taking: Jardiance    Patient reports hypoglycemic s/sx including dizziness, shakiness, sweating. Patient reports hyperglycemic symptoms including polyuria, polydipsia, polyphagia, nocturia, neuropathy, blurred vision.  Current meal patterns:  Notes he eats a lot of sweets    Objective:  Lab Results  Component Value Date   HGBA1C 14.3 (H) 01/19/2024    Lab Results  Component Value Date   CREATININE 1.20 01/19/2024   BUN 11 01/19/2024   NA 134 (L) 01/19/2024   K 4.1 01/19/2024   CL 101 01/19/2024   CO2 21 (L) 01/19/2024    Lab Results  Component Value Date   CHOL 145 01/19/2024   HDL 37.20 (L) 01/19/2024   LDLCALC 29 12/01/2022   LDLDIRECT 58.0 01/19/2024   TRIG (H) 01/19/2024    435.0 Triglyceride is over 400;  calculations on Lipids are invalid.   CHOLHDL 4 01/19/2024    Medications Reviewed Today     Reviewed by Dion Frankel, [facility] (Pharmacist) on 03/01/24 at 1622  Med List Status: <None>   Medication Order Taking? Sig Documenting Provider Last Dose Status Informant  albuterol  (VENTOLIN  HFA) 108 (90 Base) MCG/ACT inhaler [id number] Yes Inhale 1-2 puffs into the lungs every 4 (four) hours as needed for wheezing or shortness of breath. [provider] Taking Active Self, Pharmacy Records  amLODipine  (NORVASC ) 10 MG tablet [id number] Yes Take 10 mg by mouth daily. [provider] Taking Active Self, Pharmacy Records  Azilsartan Medoxomil  80 MG TABS [id number] No Take by mouth.  Patient not taking: Reported on 03/01/2024   [provider] Not Taking Active   Continuous Glucose Receiver (FREESTYLE LIBRE 3 READER) DEVI [id number]  1 Act by Does not apply route daily. Arcadio Knuckles, MD  Active   Continuous Glucose Sensor (FREESTYLE LIBRE 3 SENSOR) [place] [id number]  1 Act by Does not apply route daily. Place 1 sensor on the skin every 14 days. Use to check glucose continuously Arcadio Knuckles, MD  Active   empagliflozin  (JARDIANCE ) 25 MG TABS tablet [id number] No Take 25 mg by mouth daily.  Patient not taking: Reported on 03/01/2024   [provider] Not Taking Active Self, Pharmacy Records  Glucagon  (GVOKE HYPOPEN  2-PACK) 1 MG/0.2ML SOAJ [id number]  Inject 1 Act into the skin daily as needed. Arcadio Knuckles, MD  Active  insulin  aspart (NOVOLOG ) 100 UNIT/ML FlexPen [id number] Yes Inject 5 Units into the skin 3 (three) times daily with meals. Arcadio Knuckles, MD Taking Active   insulin  glargine (LANTUS ) 100 unit/mL SOPN [id number] Yes Inject 60 Units into the skin daily.  Patient taking differently: Inject 39 Units into the skin daily.   Arcadio Knuckles, MD Taking Active   Insulin  Pen Needle 32G X 6 MM MISC [id number]  1 Act by Does not apply route 4 (four) times daily. Arcadio Knuckles, MD  Active   meloxicam  (MOBIC ) 15 MG tablet [id number] Yes Take 15 mg by mouth daily. [provider]  Active   metFORMIN  (GLUCOPHAGE -XR) 750 MG 24 hr tablet [id number] Yes Take 1 tablet (750 mg total) by mouth daily with breakfast.  Patient taking differently: Take 1,000 mg by mouth 2 (two) times daily.   Arcadio Knuckles, MD Taking Active   methocarbamol  (ROBAXIN ) 500 MG tablet [id number]  Take 1 tablet (500 mg total) by mouth every 8 (eight) hours as needed. Hershel Los F, FNP  Active   montelukast  (SINGULAIR ) 10 MG tablet [id number] Yes Take 10 mg by mouth at bedtime. [provider] Taking Active   omeprazole (PRILOSEC) 20 MG capsule [id number] Yes Take 20 mg by mouth daily. [provider] Taking Active Self, Pharmacy Records  Patient not taking:  Discontinued 03/01/24 1622 (Duplicate)   potassium chloride  (KLOR-CON  M) 10 MEQ tablet [id number] No Take 1 tablet (10 mEq total) by mouth 2 (two) times daily.  Patient not taking: Reported on 03/01/2024   Arcadio Knuckles, MD Not Taking Active   rosuvastatin  (CRESTOR ) 20 MG tablet [id number] Yes Take 1 tablet (20 mg total) by mouth daily. Arcadio Knuckles, MD Taking Active   thiamine  (VITAMIN B1) 100 MG tablet [id number]  Take 1 tablet (100 mg total) by mouth daily. Arcadio Knuckles, MD  Active               Assessment/Plan:   Diabetes: - Currently uncontrolled, A1c goal <7% - Reviewed long term cardiovascular and renal outcomes of uncontrolled blood sugar - Reviewed goal A1c, goal fasting, and goal 2 hour post prandial glucose - Recommend to take Ozempic  and continue taking if tolerating. Continue other medications as well.  - Counseled on Freestyle Libre 3, didn't have his phone to download app and start sensor during appt - Sent sensor refills   Follow Up Plan: 4/29  Rainelle Bur, PharmD, BCPS, CPP Clinical Pharmacist Practitioner [facility] [phone]

## 2024-03-02 ENCOUNTER — Other Ambulatory Visit

## 2024-03-11 MED ORDER — FREESTYLE LIBRE 3 SENSOR MISC: 1.0000 | Freq: Every day | 5 refills | Status: AC

## 2024-03-11 NOTE — Patient Instructions (Signed)
 It was a pleasure speaking with you today!  Take Ozempic  regularly, don't stop taking when sugars improve.  Start using Freestyle Libre 3 to help keep a closer eye on your sugars and help adjust your medications.  Feel free to call with any questions or concerns!  Rainelle Bur, PharmD, BCPS, CPP Clinical Pharmacist Practitioner [facility] [phone]

## 2024-03-22 ENCOUNTER — Ambulatory Visit (INDEPENDENT_AMBULATORY_CARE_PROVIDER_SITE_OTHER): Admitting: Pharmacist

## 2024-03-22 DIAGNOSIS — E119 Type 2 diabetes mellitus without complications: Secondary | ICD-10-CM

## 2024-03-22 DIAGNOSIS — Z794 Long term (current) use of insulin: Secondary | ICD-10-CM

## 2024-03-22 NOTE — Progress Notes (Signed)
 03/22/2024 Name: Antonio Baldwin. MRN: [id number] DOB: 09-20-1956  Chief Complaint  Patient presents with   Diabetes   Medication Management    Antonio Baldwin. is a 68 y.o. year old male who was referred for medication management by their primary care provider, Antonio Knuckles, MD. They presented for a face to face visit today.   They were referred to the pharmacist by their PCP for assistance in managing diabetes     Subjective:  Care Team: Primary Care Provider: Arcadio Knuckles, MD ; Next Scheduled Visit: 02/27/2025 Follows with provider at the [facility]  Medication Access/Adherence  Current Pharmacy:  [facility] [place], [place] - [address] [place] [address] Phone: [phone] Fax: [phone]   Patient reports affordability concerns with their medications: No  Patient reports access/transportation concerns to their pharmacy: No  Patient reports adherence concerns with their medications:  Yes    Gets his medications from the [place]   Diabetes:  Current medications: Lantus  39 units daily, Novolog  5 units TID AC, metformin  1000 mg BID, Ozempic  0.5 mg weekly  Not taking: Jardiance   Home BG: reported BG was in the 200s today due to drinking fruit punch Came with Freestyle Libre 3 sensor however was unable to download app on his wife's phone and he doesn't currently have a phone. Plans to get one next month.  Patient reports hypoglycemic s/sx including dizziness, shakiness, sweating. Patient reports hyperglycemic symptoms including polyuria, polydipsia, polyphagia, nocturia, neuropathy, blurred vision.  Current meal patterns:  Notes he eats a lot of sweets    Objective:  Lab Results  Component Value Date   HGBA1C 14.3 (H) 01/19/2024    Lab Results  Component Value Date   CREATININE 1.20 01/19/2024   BUN 11 01/19/2024   NA 134 (L) 01/19/2024   K 4.1 01/19/2024   CL 101 01/19/2024   CO2 21 (L) 01/19/2024    Lab Results   Component Value Date   CHOL 145 01/19/2024   HDL 37.20 (L) 01/19/2024   LDLCALC 29 12/01/2022   LDLDIRECT 58.0 01/19/2024   TRIG (H) 01/19/2024    435.0 Triglyceride is over 400; calculations on Lipids are invalid.   CHOLHDL 4 01/19/2024    Medications Reviewed Today     Reviewed by Antonio Baldwin, [facility] (Pharmacist) on 03/22/24 at 1632  Med List Status: <None>   Medication Order Taking? Sig Documenting Provider Last Dose Status Informant  albuterol  (VENTOLIN  HFA) 108 (90 Base) MCG/ACT inhaler [id number]  Inhale 1-2 puffs into the lungs every 4 (four) hours as needed for wheezing or shortness of breath. [provider]  Active Self, Pharmacy Records  amLODipine  (NORVASC ) 10 MG tablet [id number]  Take 10 mg by mouth daily. [provider]  Active Self, Pharmacy Records  Azilsartan Medoxomil  80 MG TABS [id number]  Take by mouth.  Patient not taking: Reported on 03/01/2024   [provider]  Active   Continuous Glucose Receiver (FREESTYLE LIBRE 3 READER) DEVI [id number]  1 Act by Does not apply route daily. Antonio Knuckles, MD  Active   Continuous Glucose Sensor (FREESTYLE LIBRE 3 SENSOR) [place] [id number]  1 Act by Does not apply route daily. Place 1 sensor on the skin every 14 days. Use to check glucose continuously Antonio Knuckles, MD  Active   empagliflozin  (JARDIANCE ) 25 MG TABS tablet [id number] No Take 25 mg by mouth daily.  Patient not taking: Reported on 03/01/2024   [provider] Not Taking Active Self, Pharmacy Records  Glucagon  (GVOKE HYPOPEN  2-PACK) 1 MG/0.2ML SOAJ [id number]  Inject 1 Act into the skin daily as needed. Antonio Knuckles, MD  Active   insulin  aspart (NOVOLOG ) 100 UNIT/ML FlexPen [id number]  Inject 5 Units into the skin 3 (three) times daily with meals. Antonio Knuckles, MD  Active   insulin  glargine (LANTUS ) 100 unit/mL SOPN [id number] Yes Inject 60 Units into the skin daily.  Patient taking differently: Inject 39 Units into the skin  daily.   Antonio Knuckles, MD Taking Active   Insulin  Pen Needle 32G X 6 MM MISC [id number]  1 Act by Does not apply route 4 (four) times daily. Antonio Knuckles, MD  Active   meloxicam  (MOBIC ) 15 MG tablet [id number]  Take 15 mg by mouth daily. [provider]  Active   metFORMIN  (GLUCOPHAGE -XR) 750 MG 24 hr tablet [id number] Yes Take 1 tablet (750 mg total) by mouth daily with breakfast.  Patient taking differently: Take 1,000 mg by mouth 2 (two) times daily.   Antonio Knuckles, MD Taking Active   methocarbamol  (ROBAXIN ) 500 MG tablet [id number]  Take 1 tablet (500 mg total) by mouth every 8 (eight) hours as needed. Antonio Los F, FNP  Active   montelukast  (SINGULAIR ) 10 MG tablet [id number]  Take 10 mg by mouth at bedtime. [provider]  Active   omeprazole (PRILOSEC) 20 MG capsule [id number]  Take 20 mg by mouth daily. [provider]  Active Self, Pharmacy Records  potassium chloride  (KLOR-CON  M) 10 MEQ tablet [id number]  Take 1 tablet (10 mEq total) by mouth 2 (two) times daily.  Patient not taking: Reported on 03/01/2024   Antonio Knuckles, MD  Active   rosuvastatin  (CRESTOR ) 20 MG tablet [id number]  Take 1 tablet (20 mg total) by mouth daily. Antonio Knuckles, MD  Active   Semaglutide ,0.25 or 0.5MG /DOS, (OZEMPIC , 0.25 OR 0.5 MG/DOSE,) 2 MG/1.5ML SOPN [id number] Yes Inject 0.5 mg into the skin once a week. [provider] Taking Active            Med Note Antonio Baldwin, Antonio Baldwin   Tue Mar 01, 2024  4:23 PM) Reports he stops taking when his BG go down, still has supply  thiamine  (VITAMIN B1) 100 MG tablet [id number]  Take 1 tablet (100 mg total) by mouth daily. Antonio Knuckles, MD  Active               Assessment/Plan:   Diabetes: - Currently uncontrolled, A1c goal <7% - Reviewed long term cardiovascular and renal outcomes of uncontrolled blood sugar - Reviewed goal A1c, goal fasting, and goal 2 hour post prandial glucose - Recommend to take Ozempic  and  continue taking if tolerating. Continue other medications as well.  - Counseled on Freestyle Libre 3, didn't have his phone to download app and start sensor during appt - waiting for new phone next month.     Follow Up Plan: 7/7 OV for Libre set up  Rainelle Bur, PharmD, BCPS, CPP Clinical Pharmacist Practitioner [facility] [phone]

## 2024-05-30 ENCOUNTER — Ambulatory Visit

## 2024-06-06 ENCOUNTER — Ambulatory Visit

## 2024-06-14 ENCOUNTER — Ambulatory Visit

## 2024-06-17 ENCOUNTER — Telehealth: Payer: Self-pay | Admitting: Pharmacy Technician

## 2024-06-17 NOTE — Progress Notes (Signed)
   06/17/2024  Patient ID: Antonio Baldwin., male   DOB: [birth year], 68 y.o.   MRN: [id number]  Patient engaged with clinical pharmacist for management of diabetes on 03/22/2024. Outreach by [employer] technician was requested.   Outreached patient to discuss diabetes medication management. Left voicemail for patient to return my call at their convenience.    Fannie Alomar, CPhT [facility] Office: [phone] Email: [email or web address]

## 2024-06-21 ENCOUNTER — Telehealth: Payer: Self-pay | Admitting: Pharmacy Technician

## 2024-06-21 NOTE — Progress Notes (Signed)
   06/21/2024 Name: Antonio Baldwin. MRN: [id number] DOB: 1956/08/05  Patient is appearing on a report for [employer] [place] [employer] [facility] and last engaged with the clinical pharmacist to discuss diabetes on 03/22/2024. Contacted patient today to discuss diabetes management and completed medication review.   Diabetes Plan from last clinical pharmacist appointment:  - Currently uncontrolled, A1c goal <7% - Reviewed long term cardiovascular and renal outcomes of uncontrolled blood sugar - Reviewed goal A1c, goal fasting, and goal 2 hour post prandial glucose - Recommend to take Ozempic  and continue taking if tolerating. Continue other medications as well.  - Counseled on Freestyle Libre 3, didn't have his phone to download app and start sensor during appt - waiting for new phone next month.   Follow Up Plan: 7/7 OV for Libre set up   Medication Adherence Barriers Identified:  Patient made recommended medication changes per plan: Yes Patient informs he is taking Ozempic  0.5mg  weekly. He reports no adverse side effects. He informs he is also taking 39 units of Lantus  daily, Novolog  5 units 3 times a day with meals and Metformin  XR 1000mg  twice a day.He reports he is NOT taking Jardiance .   Access issues with any new medication or testing device: No Patient informs he has these medications listed above on hand and is taking them as prescribed above.Patient reports no cost concerns.He gets his [facility] from [facility].  Patient is checking blood sugars as prescribed: Yes Patient is NOT using CGM. Patient informs he has not received new phone. Patient may benefit from the CGM reader/device. Patient is not sure if he would like the beeping noise the phone/device would make if his blood sugars are too high or too low. He informs he is aware when his blood sugar gets too low or too high. He informs he has been doing fingerstick blood sugars though was not clear in stating how often. He informs his blood  sugar has been averaging around 135. He informs it has been down to 124. He informs he tries to watch what he is eating.  Last A1c in February was 14.3   Medication Adherence Barriers Addressed/Actions Taken:  Reviewed medication changes per plan from last clinical pharmacist note Educated patient to contact pharmacy regarding new prescriptions/refills  Reviewed instructions for monitoring blood sugars at home and reminded patient to keep a written log to review with pharmacist Reminded patient of date/time of upcoming clinical pharmacist follow up and any upcoming PCP/specialists visits. Patient denies transportation barriers to the appointment. No  Next clinical pharmacist appointment is scheduled for: TBD   Kate Caddy, CPhT [facility] Office: [phone] Email: [email or web address]

## 2024-07-26 ENCOUNTER — Telehealth: Payer: Self-pay | Admitting: *Deleted

## 2024-07-26 NOTE — Progress Notes (Unsigned)
 Complex Care Management Care Guide Note  07/26/2024 Name: Antonio Baldwin. MRN: [id number] DOB: 09-23-1956  Antonio Baldwin. is a 68 y.o. year old male who is a primary care patient of Joshua Debby LITTIE, MD and is actively engaged with the care management team. I reached out to Antonio Baldwin. by phone today to assist with scheduling  with the Pharmacist.  Follow up plan: Unsuccessful telephone outreach attempt made. A HIPAA compliant phone message was left for the patient providing contact information and requesting a return call.  Antonio Baldwin, CMA [place]  [facility], [facility] Guide Direct Dial : [phone]  Fax: [phone] Website: [email or web address]

## 2024-07-28 NOTE — Progress Notes (Signed)
 Complex Care Management Care Guide Note  07/28/2024 Name: Antonio Baldwin. MRN: [id number] DOB: 12-18-1955  Antonio Baldwin. is a 68 y.o. year old male who is a primary care patient of Joshua Debby LITTIE, MD and is actively engaged with the care management team. I reached out to Antonio Baldwin. by phone today to assist with re-scheduling  with the Pharmacist.  Follow up plan: Unsuccessful telephone outreach attempt made. A HIPAA compliant phone message was left for the patient providing contact information and requesting a return call. No further outreach attempts will be made due to inability to maintain patient contact.   Antonio Baldwin, CMA [place]  [facility], [facility] Guide Direct Dial : [phone]  Fax: [phone] Website: [email or web address]

## 2024-09-02 ENCOUNTER — Ambulatory Visit

## 2024-12-12 ENCOUNTER — Other Ambulatory Visit: Payer: Self-pay | Admitting: Internal Medicine

## 2024-12-13 ENCOUNTER — Other Ambulatory Visit: Payer: Self-pay

## 2024-12-13 ENCOUNTER — Ambulatory Visit (HOSPITAL_BASED_OUTPATIENT_CLINIC_OR_DEPARTMENT_OTHER): Admission: RE | Admit: 2024-12-13 | Source: Ambulatory Visit

## 2024-12-13 ENCOUNTER — Encounter (HOSPITAL_BASED_OUTPATIENT_CLINIC_OR_DEPARTMENT_OTHER): Payer: Self-pay

## 2024-12-13 ENCOUNTER — Emergency Department (HOSPITAL_BASED_OUTPATIENT_CLINIC_OR_DEPARTMENT_OTHER)

## 2024-12-13 ENCOUNTER — Emergency Department (HOSPITAL_BASED_OUTPATIENT_CLINIC_OR_DEPARTMENT_OTHER)
Admission: EM | Admit: 2024-12-13 | Discharge: 2024-12-13 | Disposition: A | Discharge status: Home / Self Care | Attending: Emergency Medicine | Admitting: Emergency Medicine

## 2024-12-13 DIAGNOSIS — R110 Nausea: Secondary | ICD-10-CM | POA: Diagnosis present

## 2024-12-13 DIAGNOSIS — Z7984 Long term (current) use of oral hypoglycemic drugs: Secondary | ICD-10-CM | POA: Insufficient documentation

## 2024-12-13 DIAGNOSIS — M545 Low back pain, unspecified: Secondary | ICD-10-CM | POA: Insufficient documentation

## 2024-12-13 DIAGNOSIS — E1165 Type 2 diabetes mellitus with hyperglycemia: Secondary | ICD-10-CM | POA: Diagnosis not present

## 2024-12-13 DIAGNOSIS — R630 Anorexia: Secondary | ICD-10-CM | POA: Insufficient documentation

## 2024-12-13 DIAGNOSIS — R739 Hyperglycemia, unspecified: Secondary | ICD-10-CM

## 2024-12-13 DIAGNOSIS — R059 Cough, unspecified: Secondary | ICD-10-CM | POA: Insufficient documentation

## 2024-12-13 DIAGNOSIS — Z7951 Long term (current) use of inhaled steroids: Secondary | ICD-10-CM | POA: Insufficient documentation

## 2024-12-13 DIAGNOSIS — Z79899 Other long term (current) drug therapy: Secondary | ICD-10-CM | POA: Insufficient documentation

## 2024-12-13 DIAGNOSIS — I10 Essential (primary) hypertension: Secondary | ICD-10-CM | POA: Diagnosis not present

## 2024-12-13 DIAGNOSIS — R1011 Right upper quadrant pain: Secondary | ICD-10-CM | POA: Diagnosis not present

## 2024-12-13 DIAGNOSIS — Z794 Long term (current) use of insulin: Secondary | ICD-10-CM | POA: Diagnosis not present

## 2024-12-13 DIAGNOSIS — R1013 Epigastric pain: Secondary | ICD-10-CM | POA: Insufficient documentation

## 2024-12-13 DIAGNOSIS — J45909 Unspecified asthma, uncomplicated: Secondary | ICD-10-CM | POA: Insufficient documentation

## 2024-12-13 DIAGNOSIS — R1084 Generalized abdominal pain: Secondary | ICD-10-CM | POA: Diagnosis not present

## 2024-12-13 LAB — URINALYSIS, ROUTINE W REFLEX MICROSCOPIC
Bacteria, UA: NONE SEEN
Bilirubin Urine: NEGATIVE
Glucose, UA: >1000 mg/dL — AB
Hgb urine dipstick: NEGATIVE
Ketones, ur: 15 mg/dL — AB
Leukocytes,Ua: NEGATIVE
Nitrite: NEGATIVE
Protein, ur: 30 mg/dL — AB
Specific Gravity, Urine: 1.032 — ABNORMAL HIGH (ref 1.005–1.030)
pH: 5.5 (ref 5.0–8.0)

## 2024-12-13 LAB — COMPREHENSIVE METABOLIC PANEL WITH GFR
ALT: 22 U/L (ref 0–44)
AST: 17 U/L (ref 15–41)
Albumin: 4.4 g/dL (ref 3.5–5.0)
Alkaline Phosphatase: 85 U/L (ref 38–126)
Anion gap: 15 (ref 5–15)
BUN: 17 mg/dL (ref 8–23)
CO2: 24 mmol/L (ref 22–32)
Calcium: 9.9 mg/dL (ref 8.9–10.3)
Chloride: 95 mmol/L — ABNORMAL LOW (ref 98–111)
Creatinine, Ser: 1 mg/dL (ref 0.61–1.24)
GFR, Estimated: >60 mL/min
Glucose, Bld: 330 mg/dL — ABNORMAL HIGH (ref 70–99)
Potassium: 3.7 mmol/L (ref 3.5–5.1)
Sodium: 134 mmol/L — ABNORMAL LOW (ref 135–145)
Total Bilirubin: 0.7 mg/dL (ref 0.0–1.2)
Total Protein: 7.7 g/dL (ref 6.5–8.1)

## 2024-12-13 LAB — RESP PANEL BY RT-PCR (RSV, FLU A&B, COVID)  RVPGX2
Influenza A by PCR: NEGATIVE
Influenza B by PCR: NEGATIVE
Resp Syncytial Virus by PCR: NEGATIVE
SARS Coronavirus 2 by RT PCR: NEGATIVE

## 2024-12-13 LAB — CBC
HCT: 41.9 % (ref 39.0–52.0)
Hemoglobin: 14.1 g/dL (ref 13.0–17.0)
MCH: 28.6 pg (ref 26.0–34.0)
MCHC: 33.7 g/dL (ref 30.0–36.0)
MCV: 85 fL (ref 80.0–100.0)
Platelets: 344 K/uL (ref 150–400)
RBC: 4.93 MIL/uL (ref 4.22–5.81)
RDW: 12.5 % (ref 11.5–15.5)
WBC: 8.2 K/uL (ref 4.0–10.5)
nRBC: 0 % (ref 0.0–0.2)

## 2024-12-13 LAB — CBG MONITORING, ED
Glucose-Capillary: 302 mg/dL — ABNORMAL HIGH (ref 70–99)
Glucose-Capillary: 140 mg/dL — ABNORMAL HIGH (ref 70–99)

## 2024-12-13 LAB — BETA-HYDROXYBUTYRIC ACID: Beta-Hydroxybutyric Acid: 0.65 mmol/L — ABNORMAL HIGH (ref 0.05–0.27)

## 2024-12-13 LAB — LIPASE, BLOOD: Lipase: 37 U/L (ref 11–51)

## 2024-12-13 MED ORDER — ONDANSETRON HCL 4 MG PO TABS: 4.0000 mg | ORAL_TABLET | Freq: Four times a day (QID) | ORAL | 0 refills | Status: AC

## 2024-12-13 MED ORDER — LACTATED RINGERS IV BOLUS
1000.0000 mL | Freq: Once | INTRAVENOUS | Status: AC
  Administered 2024-12-13: 1000 mL via INTRAVENOUS

## 2024-12-13 NOTE — Discharge Instructions (Signed)
 You were seen today for high blood sugar, nausea, decreased appetite and generalized abdominal discomfort.  Your physical exam today was very reassuring, the low suspicion for any emergent cause recent today.  Your lab work did note a very high blood sugar with dehydration.  Recommending continue to hydrate frequently.  If you begin to have any new or worsening symptoms, please return to the ER immediately.  Otherwise continue to follow-up with your PCP as soon as possible.  I am sending in some nausea medication to help with nausea in the interim.

## 2024-12-13 NOTE — ED Triage Notes (Signed)
 Pt c/o emesis and decreased appetite for one week. New cough. Denies fevers.

## 2024-12-13 NOTE — ED Provider Notes (Signed)
 " [place] [facility] AT [facility] Provider Note   CSN: [id number] Arrival date & time: 12/13/24  1240     Patient presents with: Emesis   Antonio Murty. is a 69 y.o. male.   Emesis Patient is a 69 year old male previous medical history of type 2 diabetes, HTN, GERD, asthma presenting ED today for concerns for 1 week history of lack of appetite and, generalized weakness, nausea, GERD, generalized abdominal pain localizing to epigastric and right upper quadrant.  Reporting that first 2 days additionally having cough, congestion, watery diarrhea and multiple episodes of emesis, with no recurrence since.  Since then, noted to reported a 18 pound weight loss in the last week.  Additionally reporting some mild bilateral paraspinal low back pain without any midline tenderness.  Denies saddle paresthesia, fecal/urinary incontinence, dysuria, hematuria, fever, trauma, IVD use.   Reported to be tolerating fluids without difficulty.  But has not been able to eat regular meals in the last week.  Currently denies fever, headache, blurry vision, unilateral weakness, chest pain, shortness of breath, cough, congestion, diarrhea, melena, hematochezia.  Prior to Admission medications  Medication Sig Start Date End Date Taking? Authorizing Provider  ondansetron  (ZOFRAN ) 4 MG tablet Take 1 tablet (4 mg total) by mouth every 6 (six) hours. 12/13/24  Yes Tysen Roesler S, PA-C  albuterol  (VENTOLIN  HFA) 108 (90 Base) MCG/ACT inhaler Inhale 1-2 puffs into the lungs every 4 (four) hours as needed for wheezing or shortness of breath.    [provider]  amLODipine  (NORVASC ) 10 MG tablet Take 10 mg by mouth daily.    [provider]  Azilsartan Medoxomil  80 MG TABS Take by mouth. Patient not taking: Reported on 03/01/2024 06/24/21   [provider]  Continuous Glucose Receiver (FREESTYLE LIBRE 3 READER) DEVI 1 Act by Does not apply route daily. 01/20/24   Joshua Debby LITTIE, MD  Continuous Glucose Sensor (FREESTYLE LIBRE 3 SENSOR) MISC 1 Act by Does not apply route daily. Place 1 sensor on the skin every 14 days. Use to check glucose continuously 03/11/24   Joshua Debby LITTIE, MD  empagliflozin  (JARDIANCE ) 25 MG TABS tablet Take 25 mg by mouth daily. Patient not taking: Reported on 03/01/2024    [provider]  Glucagon  (GVOKE HYPOPEN  2-PACK) 1 MG/0.2ML SOAJ Inject 1 Act into the skin daily as needed. 01/20/24   Joshua Debby LITTIE, MD  insulin  aspart (NOVOLOG ) 100 UNIT/ML FlexPen Inject 5 Units into the skin 3 (three) times daily with meals. 01/22/24   Joshua Debby LITTIE, MD  insulin  glargine (LANTUS ) 100 unit/mL SOPN Inject 60 Units into the skin daily. Patient taking differently: Inject 39 Units into the skin daily. 01/20/24   Joshua Debby LITTIE, MD  Insulin  Pen Needle 32G X 6 MM MISC 1 Act by Does not apply route 4 (four) times daily. 01/20/24   Joshua Debby LITTIE, MD  meloxicam  (MOBIC ) 15 MG tablet Take 15 mg by mouth daily.    [provider]  metFORMIN  (GLUCOPHAGE -XR) 750 MG 24 hr tablet Take 1 tablet (750 mg total) by mouth daily with breakfast. Patient taking differently: Take 1,000 mg by mouth 2 (two) times daily. 01/20/24   Joshua Debby LITTIE, MD  methocarbamol  (ROBAXIN ) 500 MG tablet Take 1 tablet (500 mg total) by mouth every 8 (eight) hours as needed. 12/28/23   Merlynn Niki FALCON, FNP  montelukast  (SINGULAIR ) 10 MG tablet Take 10 mg by mouth at bedtime.    [provider]  omeprazole (PRILOSEC) 20 MG capsule Take 20 mg by mouth daily.    [provider]  potassium chloride  (KLOR-CON  M) 10 MEQ tablet Take 1 tablet (10 mEq total) by mouth 2 (two) times daily. Patient not taking: Reported on 03/01/2024 12/05/22   Joshua Debby CROME, MD  rosuvastatin  (CRESTOR ) 20 MG tablet Take 1 tablet (20 mg total) by mouth daily. 01/22/24   Joshua Debby CROME, MD  Semaglutide ,0.25 or 0.5MG /DOS, (OZEMPIC , 0.25 OR 0.5 MG/DOSE,) 2 MG/1.5ML SOPN Inject 0.5 mg into the  skin once a week.    [provider]  thiamine  (VITAMIN B1) 100 MG tablet Take 1 tablet (100 mg total) by mouth daily. 12/08/22   Joshua Debby CROME, MD    Allergies: Patient has no known allergies.    Review of Systems  Constitutional:  Positive for appetite change.  Gastrointestinal:  Positive for nausea.  All other systems reviewed and are negative.   Updated Vital Signs BP 130/86 (BP Location: Right Arm)   Pulse 99   Temp 98.9 F (37.2 C) (Oral)   Resp 18   Ht 5' 8 (1.727 m)   Wt 70.3 kg   SpO2 99%   BMI 23.57 kg/m   Physical Exam Vitals and nursing note reviewed.  Constitutional:      General: He is not in acute distress.    Appearance: Normal appearance. He is not ill-appearing or diaphoretic.  HENT:     Head: Normocephalic and atraumatic.     Mouth/Throat:     Mouth: Mucous membranes are moist.     Pharynx: Oropharynx is clear. No oropharyngeal exudate or posterior oropharyngeal erythema.  Eyes:     General: No scleral icterus.       Right eye: No discharge.        Left eye: No discharge.     Extraocular Movements: Extraocular movements intact.     Conjunctiva/sclera: Conjunctivae normal.     Pupils: Pupils are equal, round, and reactive to light.  Cardiovascular:     Rate and Rhythm: Normal rate and regular rhythm.     Pulses: Normal pulses.     Heart sounds: Normal heart sounds. No murmur heard.    No friction rub. No gallop.  Pulmonary:     Effort: Pulmonary effort is normal. No respiratory distress.     Breath sounds: No stridor. No wheezing, rhonchi or rales.  Chest:     Chest wall: No tenderness.  Abdominal:     General: Abdomen is flat. There is no distension.     Palpations: Abdomen is soft.     Tenderness: There is abdominal tenderness (Noted to have generalized abdominal pain localizing to epigastric and right upper quadrant). There is no right CVA tenderness, left CVA tenderness, guarding or rebound.  Musculoskeletal:        General: No  swelling, deformity or signs of injury.     Cervical back: Normal range of motion. No rigidity or tenderness.     Right lower leg: No edema.     Left lower leg: No edema.  Skin:    General: Skin is warm and dry.     Findings: No bruising, erythema or lesion.  Neurological:     General: No focal deficit present.     Mental Status: He is alert and oriented to person, place, and time. Mental status is at baseline.     Sensory: No sensory deficit.     Motor: No weakness.  Psychiatric:  Mood and Affect: Mood normal.     (all labs ordered are listed, but only abnormal results are displayed) Labs Reviewed  COMPREHENSIVE METABOLIC PANEL WITH GFR - Abnormal; Notable for the following components:      Result Value   Sodium 134 (*)    Chloride 95 (*)    Glucose, Bld 330 (*)    All other components within normal limits  URINALYSIS, ROUTINE W REFLEX MICROSCOPIC - Abnormal; Notable for the following components:   Specific Gravity, Urine 1.032 (*)    Glucose, UA >1,000 (*)    Ketones, ur 15 (*)    Protein, ur 30 (*)    All other components within normal limits  BETA-HYDROXYBUTYRIC ACID - Abnormal; Notable for the following components:   Beta-Hydroxybutyric Acid 0.65 (*)    All other components within normal limits  CBG MONITORING, ED - Abnormal; Notable for the following components:   Glucose-Capillary 302 (*)    All other components within normal limits  CBG MONITORING, ED - Abnormal; Notable for the following components:   Glucose-Capillary 140 (*)    All other components within normal limits  RESP PANEL BY RT-PCR (RSV, FLU A&B, COVID)  RVPGX2  LIPASE, BLOOD  CBC    EKG: EKG Interpretation Date/Time:  Tuesday December 13 2024 15:14:24 EST Ventricular Rate:  91 PR Interval:  137 QRS Duration:  98 QT Interval:  373 QTC Calculation: 459 R Axis:   82  Text Interpretation: Sinus rhythm Borderline right axis deviation Non-specific t-wave inversion in lead III, otherwise  unchanged from last EKG Confirmed by Ellouise Fine ([id number]) on 12/13/2024 3:35:38 PM  Radiology: No results found.  Procedures   Medications Ordered in the ED  lactated ringers  bolus 1,000 mL (0 mLs Intravenous Stopped 12/13/24 1630)    Medical Decision Making Amount and/or Complexity of Data Reviewed Labs: ordered.   This patient is a 69 year old male who presents to the ED for concern of 1 week long history of lack of appetite, GERD, generalized weakness and generalized abdominal pain localizing to epigastric right upper quadrant region, worse after eating.  Started after flulike symptoms x 2 days.  Since then has been taking his insulin .  Most notably having drink a large soda before coming to the ED.  On physical exam, patient is in no acute distress, afebrile, alert and orient x 4, speaking in full sentences, nontachypneic, nontachycardic.  Notably has epigastric abdominal pain and right upper quadrant pain.  Negative Murphy sign.  No CVA tenderness.  No lower leg edema.  LCTAB, RRR, no murmur.  Patient overall well-appearing.  Reporting an 18 pound weight loss however when reviewing weights, has no weight change from 10 months ago.  However noted to have some mild bilateral paraspinal lumbar back pain.  Without any red flag symptoms otherwise.  Respiratory panel was ordered secondary to flulike symptoms at initial symptom onset.  Patient has not had any actual weight loss.  CT scans are down at this facility at this time.  Had shared decision making after fluids were provided and patient feeling significantly better.  Patient lab work revealed a hyperglycemia with mildly elevated beta hydroxybutyrate acid however without an anion gap, do not suspect DKA at this time.  No nausea or vomiting.  Will have him continue to hydrate at home, with patient wishing to go home at this time and not want any further workup.  Believe this is reasonable.  He is scheduling a visit with his PCP  tomorrow.  Provided  strict return to ER precautions.  Providing some nausea medication for him to use at home.  Patient vital signs have remained stable throughout the course of patient's time in the ED. Low suspicion for any other emergent pathology at this time. I believe this patient is safe to be discharged. Provided strict return to ER precautions. Patient expressed agreement and understanding of plan. All questions were answered.  Differential diagnoses prior to evaluation: The emergent differential diagnosis includes, but is not limited to, cholecystitis, cholangitis, pancreatitis, hepatitis, gastritis, gastroenteritis, PUD, GERD, ACS, diverticulitis, AAA. This is not an exhaustive differential.   Past Medical History / Co-morbidities / Social History: Asthma, arthritis, type II DM, GERD, HTN, anemia,  Additional history: Chart reviewed. Pertinent results include:   [facility]  Lab Tests/Imaging studies: I personally interpreted labs/imaging and the pertinent results include:    CBC unremarkable CMP notes elevated glucose of 330 but otherwise unremarkable Lipase unremarkable. UA does note ketones in the urine as well as an elevated specific gravity and glucose, likely secondary to dehydration Beta-hydroxybutyrate acid mildly elevated at 0.65 Respiratory panel negative  Cardiac monitoring: EKG obtained and interpreted by myself and attending physician which shows: Sinus rhythm  EKG Interpretation Date/Time:  Tuesday December 13 2024 15:14:24 EST Ventricular Rate:  91 PR Interval:  137 QRS Duration:  98 QT Interval:  373 QTC Calculation: 459 R Axis:   82  Text Interpretation: Sinus rhythm Borderline right axis deviation Non-specific t-wave inversion in lead III, otherwise unchanged from last EKG Confirmed by Ellouise Fine ([id number]) on 12/13/2024 3:35:38 PM          Medications: I ordered medication including Zofran , LR.  I have reviewed the  patients home medicines and have made adjustments as needed.  Critical Interventions: None  Social Determinants of Health: Has good follow-up with PCP  Disposition: After consideration of the diagnostic results and the patients response to treatment, I feel that the patient would benefit from discharge and treat as above.   emergency department workup does not suggest an emergent condition requiring admission or immediate intervention beyond what has been performed at this time. The plan is: Follow-up with PCP, return to the ER for new or worsening symptoms, symptomatic management at home. The patient is safe for discharge and has been instructed to return immediately for worsening symptoms, change in symptoms or any other concerns.   Final diagnoses:  Hyperglycemia  Nausea  Decreased appetite  Generalized abdominal discomfort    ED Discharge Orders          Ordered    ondansetron  (ZOFRAN ) 4 MG tablet  Every 6 hours        12/13/24 1727               Antonio Baldwin S, PA-C 12/13/24 1730    Kingsley, Victoria K, DO 12/13/24 1832  "

## 2025-02-23 ENCOUNTER — Encounter: Admitting: Internal Medicine

## 2025-02-23 ENCOUNTER — Ambulatory Visit
